# Patient Record
Sex: Female | Born: 1935
Health system: Southern US, Community
[De-identification: ages and names within clinical notes are randomized; demographics above are authoritative.]

## PROBLEM LIST (undated history)

## (undated) DIAGNOSIS — Z862 Personal history of diseases of the blood and blood-forming organs and certain disorders involving the immune mechanism: Secondary | ICD-10-CM

## (undated) DIAGNOSIS — M1990 Unspecified osteoarthritis, unspecified site: Secondary | ICD-10-CM

## (undated) DIAGNOSIS — E7800 Pure hypercholesterolemia, unspecified: Secondary | ICD-10-CM

## (undated) DIAGNOSIS — E039 Hypothyroidism, unspecified: Secondary | ICD-10-CM

## (undated) DIAGNOSIS — T8484XA Pain due to internal orthopedic prosthetic devices, implants and grafts, initial encounter: Secondary | ICD-10-CM

## (undated) HISTORY — PX: BUNIONECTOMY: SHX129

---

## 2002-05-17 ENCOUNTER — Encounter: Admission: RE | Admit: 2002-05-17 | Discharge: 2002-05-17 | Payer: Self-pay | Admitting: Internal Medicine

## 2002-05-17 ENCOUNTER — Encounter: Payer: Self-pay | Admitting: Internal Medicine

## 2003-11-08 ENCOUNTER — Other Ambulatory Visit: Admission: RE | Admit: 2003-11-08 | Discharge: 2003-11-08 | Payer: Self-pay | Admitting: Internal Medicine

## 2004-01-04 ENCOUNTER — Encounter: Admission: RE | Admit: 2004-01-04 | Discharge: 2004-01-04 | Payer: Self-pay | Admitting: Internal Medicine

## 2004-09-05 ENCOUNTER — Encounter: Admission: RE | Admit: 2004-09-05 | Discharge: 2004-09-05 | Payer: Self-pay | Admitting: Internal Medicine

## 2005-02-20 ENCOUNTER — Encounter: Admission: RE | Admit: 2005-02-20 | Discharge: 2005-02-20 | Payer: Self-pay | Admitting: Internal Medicine

## 2006-04-20 ENCOUNTER — Other Ambulatory Visit: Admission: RE | Admit: 2006-04-20 | Discharge: 2006-04-20 | Payer: Self-pay | Admitting: *Deleted

## 2006-04-21 ENCOUNTER — Encounter: Admission: RE | Admit: 2006-04-21 | Discharge: 2006-04-21 | Payer: Self-pay | Admitting: *Deleted

## 2008-07-10 ENCOUNTER — Encounter: Admission: RE | Admit: 2008-07-10 | Discharge: 2008-07-10 | Payer: Self-pay | Admitting: Family Medicine

## 2010-03-01 ENCOUNTER — Encounter
Admission: RE | Admit: 2010-03-01 | Discharge: 2010-03-01 | Payer: Self-pay | Source: Home / Self Care | Attending: Internal Medicine | Admitting: Internal Medicine

## 2010-03-06 ENCOUNTER — Encounter
Admission: RE | Admit: 2010-03-06 | Discharge: 2010-03-06 | Payer: Self-pay | Source: Home / Self Care | Attending: Internal Medicine | Admitting: Internal Medicine

## 2011-03-31 ENCOUNTER — Other Ambulatory Visit: Payer: Self-pay | Admitting: Family Medicine

## 2011-03-31 DIAGNOSIS — Z1231 Encounter for screening mammogram for malignant neoplasm of breast: Secondary | ICD-10-CM

## 2011-06-04 ENCOUNTER — Ambulatory Visit
Admission: RE | Admit: 2011-06-04 | Discharge: 2011-06-04 | Disposition: A | Discharge status: Home / Self Care | Payer: Medicare Other | Source: Ambulatory Visit | Attending: Family Medicine | Admitting: Family Medicine

## 2011-06-04 DIAGNOSIS — Z1231 Encounter for screening mammogram for malignant neoplasm of breast: Secondary | ICD-10-CM

## 2011-08-12 ENCOUNTER — Ambulatory Visit
Admission: RE | Admit: 2011-08-12 | Discharge: 2011-08-12 | Disposition: A | Discharge status: Home / Self Care | Payer: Medicare Other | Source: Ambulatory Visit | Attending: Orthopedic Surgery | Admitting: Orthopedic Surgery

## 2011-08-12 ENCOUNTER — Other Ambulatory Visit: Payer: Self-pay | Admitting: Orthopedic Surgery

## 2011-08-12 DIAGNOSIS — N289 Disorder of kidney and ureter, unspecified: Secondary | ICD-10-CM

## 2011-08-12 MED ORDER — IOHEXOL 350 MG/ML SOLN
100.0000 mL | Freq: Once | INTRAVENOUS | Status: AC | PRN
  Administered 2011-08-12: 100 mL via INTRAVENOUS

## 2011-12-24 ENCOUNTER — Other Ambulatory Visit: Payer: Self-pay | Admitting: Family Medicine

## 2011-12-24 ENCOUNTER — Other Ambulatory Visit (HOSPITAL_COMMUNITY)
Admission: RE | Admit: 2011-12-24 | Discharge: 2011-12-24 | Disposition: A | Discharge status: Home / Self Care | Payer: Medicare Other | Source: Ambulatory Visit | Attending: Family Medicine | Admitting: Family Medicine

## 2011-12-24 DIAGNOSIS — Z780 Asymptomatic menopausal state: Secondary | ICD-10-CM

## 2011-12-24 DIAGNOSIS — Z124 Encounter for screening for malignant neoplasm of cervix: Secondary | ICD-10-CM | POA: Insufficient documentation

## 2011-12-24 DIAGNOSIS — Z1231 Encounter for screening mammogram for malignant neoplasm of breast: Secondary | ICD-10-CM

## 2012-04-30 ENCOUNTER — Other Ambulatory Visit: Payer: Self-pay

## 2012-11-19 ENCOUNTER — Ambulatory Visit (HOSPITAL_COMMUNITY): Payer: Medicare Other | Attending: Internal Medicine | Admitting: Radiology

## 2012-11-19 ENCOUNTER — Other Ambulatory Visit (HOSPITAL_COMMUNITY): Payer: Self-pay | Admitting: Internal Medicine

## 2012-11-19 DIAGNOSIS — M79609 Pain in unspecified limb: Secondary | ICD-10-CM | POA: Insufficient documentation

## 2012-11-19 DIAGNOSIS — R0789 Other chest pain: Secondary | ICD-10-CM | POA: Insufficient documentation

## 2012-11-19 DIAGNOSIS — R6884 Jaw pain: Secondary | ICD-10-CM | POA: Insufficient documentation

## 2012-11-19 DIAGNOSIS — R072 Precordial pain: Secondary | ICD-10-CM

## 2012-11-19 NOTE — Progress Notes (Signed)
Echocardiogram performed.  

## 2012-11-25 IMAGING — CT CT ABDOMEN WO/W CM
2 of 8 series · 4 of 32 positions shown, 9 images · IV contrast ([ID] [id number])
Comparison: None

CLINICAL DATA: Follow-up renal lesion.

BUN and creatinine were obtained on site at [HOSPITAL] at
[HOSPITAL]..
Results:  BUN 24.0 mg/dL,  Creatinine 0.8 mg/dL.
CT ABDOMEN WITHOUT AND WITH CONTRAST
TECHNIQUE: Multidetector CT imaging of the abdomen was performed
following the standard protocol before and during bolus
administration of intravenous contrast.
Contrast: 100mL OMNIPAQUE IOHEXOL 350 MG/ML SOLN

[Series 104: sag art · sagittal · 0.66mm/px · 3 of 147 slices shown, 7 images]
[im 37/147  soft-tissue]
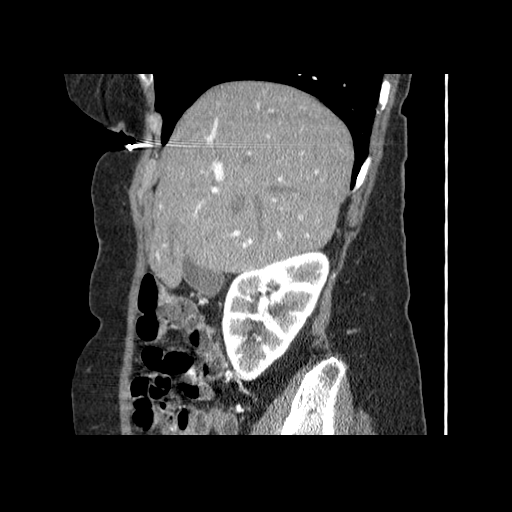
[im 37/147  lung]
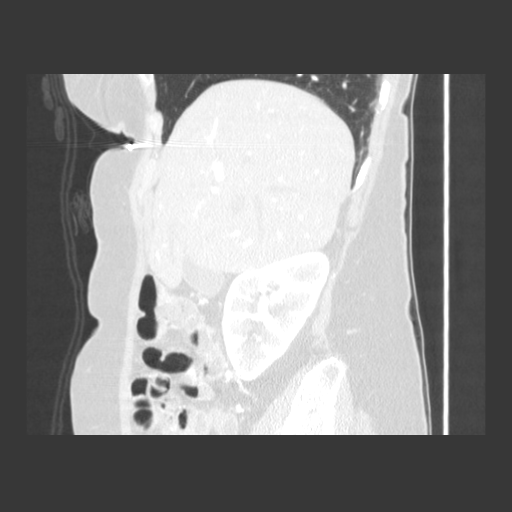
[im 37/147  bone]
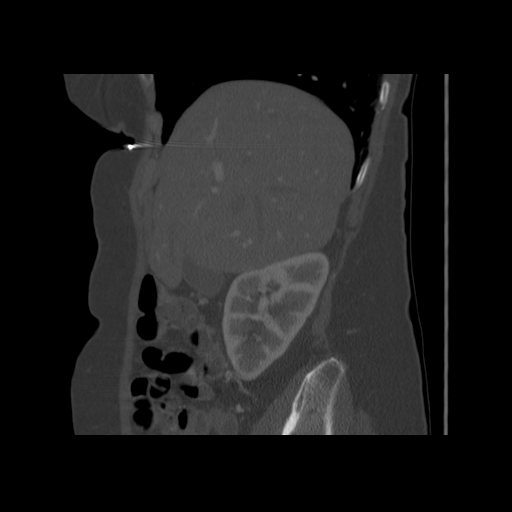
[im 74/147  soft-tissue]
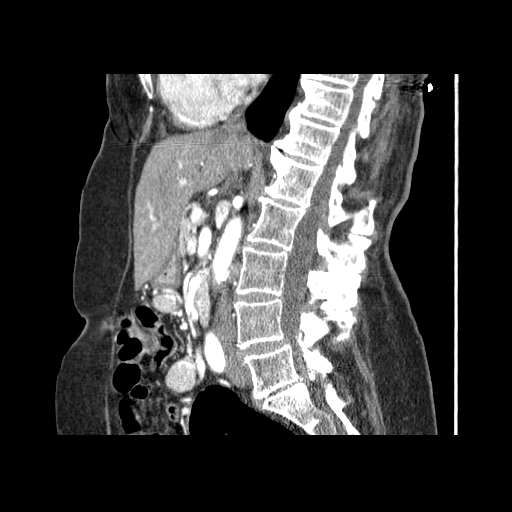
[im 74/147  lung]
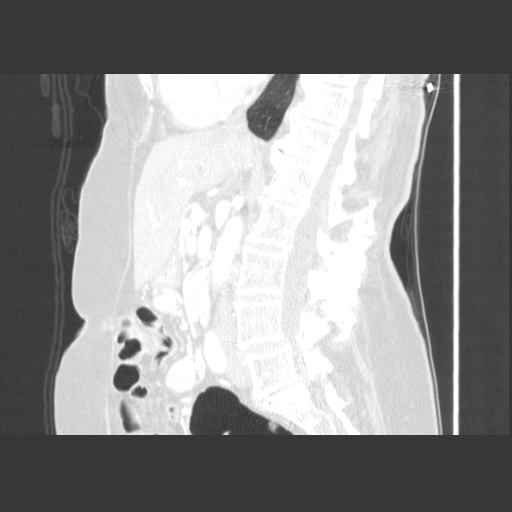
[im 110/147  soft-tissue]
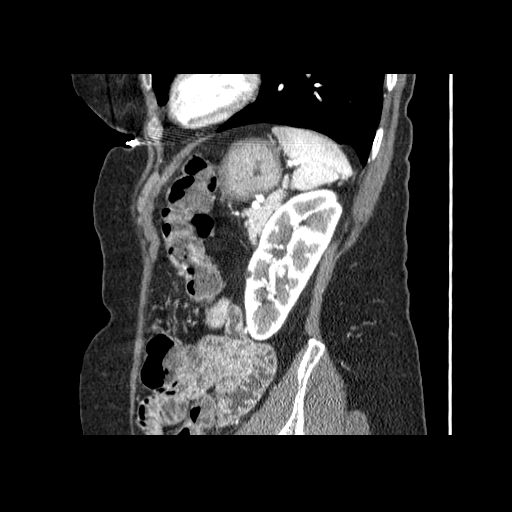
[im 110/147  lung]
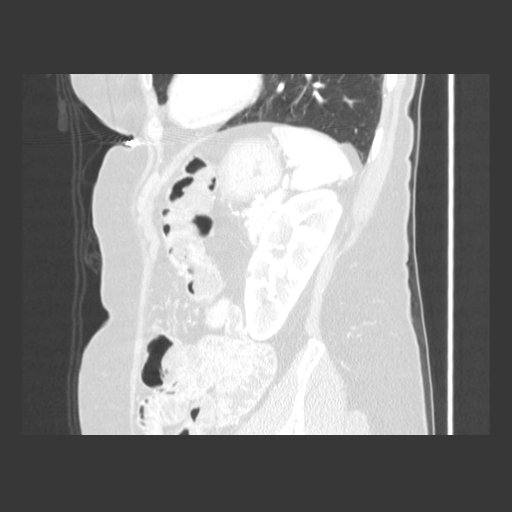

[Series 106: sag nephro · sagittal · 0.66mm/px · 1 of 151 slices shown, 2 images]
[im 76/151  soft-tissue]
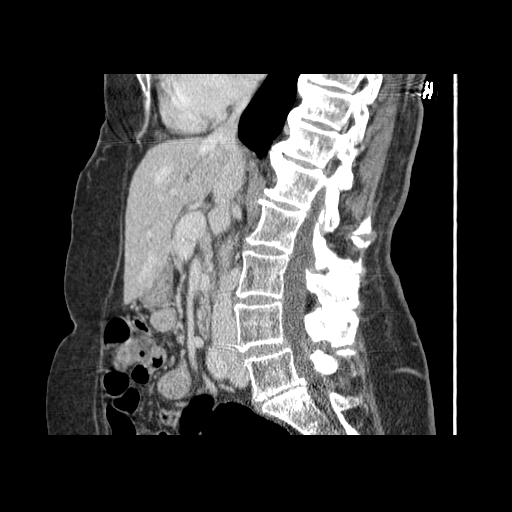
[im 76/151  bone]
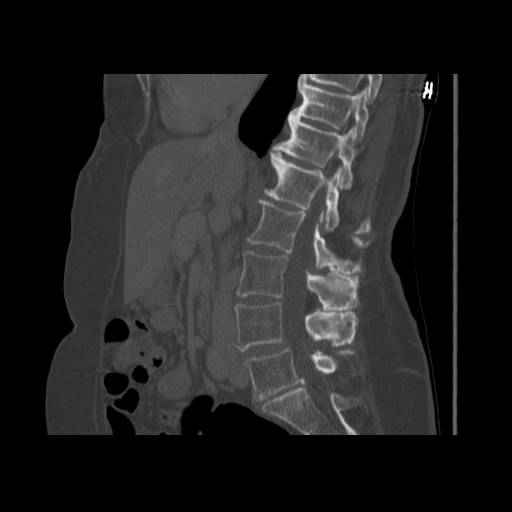

[4 of 32 positions shown; findings below may reference images not displayed]

FINDINGS: The lung bases are clear.

The precontrast images demonstrate a 11 mm lesion involving the
lateral cortex of the right kidney at the lower pole mid pole
junction region.  This has areas of low attenuation.  After
contrast administration there is demonstrable contrast enhancement.
The delayed images demonstrate significant washout of the lesion
with areas of low attenuation suggesting fat.  Findings are most
consistent with a small angiomyolipoma.

There are two cysts associated the left kidney.  No hydronephrosis
or hydroureter.  No collecting system abnormalities on the delayed
images.

The liver is unremarkable.  No focal lesions or intrahepatic
biliary dilatation.  The gallbladder is normal.  No common bile
duct dilatation.  The pancreas is unremarkable.  There appears to
be either a fatty cleft or small lipoma in the pancreatic head.
This measures negative 56 HU.  The spleen is normal in size.  No
focal lesions.  The adrenal glands are unremarkable.

The stomach, duodenum, visualized small bowel and visualized colon
are unremarkable.  No inflammatory changes or mass lesions.  The
aorta demonstrates mild atherosclerotic calcifications but no focal
aneurysm or dissection.  The major branch vessels are patent.  No
mesenteric or retroperitoneal masses or adenopathy.

The bony structures are intact.
IMPRESSION: 1.  11 mm right renal lesion appears to have fatty elements and is
likely a small angiomyolipoma.  A follow-up examination in 1 year
is recommended.
2.  Left renal cysts.
3.  Small fatty cleft versus lipoma in the pancreatic head.

## 2013-05-13 ENCOUNTER — Other Ambulatory Visit: Payer: Self-pay | Admitting: Internal Medicine

## 2013-05-13 DIAGNOSIS — D369 Benign neoplasm, unspecified site: Secondary | ICD-10-CM

## 2013-05-27 ENCOUNTER — Inpatient Hospital Stay: Admission: RE | Admit: 2013-05-27 | Payer: Medicare Other | Source: Ambulatory Visit

## 2013-05-31 ENCOUNTER — Inpatient Hospital Stay: Admission: RE | Admit: 2013-05-31 | Payer: Medicare Other | Source: Ambulatory Visit

## 2013-06-07 ENCOUNTER — Other Ambulatory Visit: Payer: Medicare Other

## 2013-06-13 ENCOUNTER — Ambulatory Visit
Admission: RE | Admit: 2013-06-13 | Discharge: 2013-06-13 | Disposition: A | Discharge status: Home / Self Care | Payer: Medicare Other | Source: Ambulatory Visit | Attending: Internal Medicine | Admitting: Internal Medicine

## 2013-06-13 DIAGNOSIS — D369 Benign neoplasm, unspecified site: Secondary | ICD-10-CM

## 2014-05-29 ENCOUNTER — Other Ambulatory Visit: Payer: Self-pay | Admitting: Internal Medicine

## 2014-05-29 DIAGNOSIS — Z1231 Encounter for screening mammogram for malignant neoplasm of breast: Secondary | ICD-10-CM

## 2014-06-27 ENCOUNTER — Ambulatory Visit
Admission: RE | Admit: 2014-06-27 | Discharge: 2014-06-27 | Disposition: A | Discharge status: Home / Self Care | Payer: Medicare Other | Source: Ambulatory Visit | Attending: Internal Medicine | Admitting: Internal Medicine

## 2014-06-27 ENCOUNTER — Encounter (INDEPENDENT_AMBULATORY_CARE_PROVIDER_SITE_OTHER): Payer: Self-pay

## 2014-06-27 DIAGNOSIS — Z1231 Encounter for screening mammogram for malignant neoplasm of breast: Secondary | ICD-10-CM

## 2014-10-25 NOTE — H&P (Signed)
  Courtney Boyer is an 81 y.o. female.    Chief Complaint: left shoulder pain and weakness  HPI: Pt is a 81 y.o. female complaining of left shoulder pain for multiple months. Pain had continually increased since the beginning. X-rays in the clinic show rotator cuff tear left shoulder. Pt has tried various conservative treatments which have failed to alleviate their symptoms, including injections and therapy. Various options are discussed with the patient. Risks, benefits and expectations were discussed with the patient. Patient understand the risks, benefits and expectations and wishes to proceed with surgery.   PCP:  No primary care provider on file.  D/C Plans: Home  PMH: No past medical history on file.  PSH: No past surgical history on file.  Social History:  has no tobacco, alcohol, and drug history on file.  Allergies:  Allergies not on file  Medications: No current facility-administered medications for this encounter.   No current outpatient prescriptions on file.    No results found for this or any previous visit (from the past 48 hour(s)). No results found.  ROS: Pain with rom of the left upper extremity  Physical Exam:  Alert and oriented 81 y.o. female in no acute distress Cranial nerves 2-12 intact Cervical spine: full rom with no tenderness, nv intact distally Chest: active breath sounds bilaterally, no wheeze rhonchi or rales Heart: regular rate and rhythm, no murmur Abd: non tender non distended with active bowel sounds Hip is stable with rom  Left shoulder with mild to moderate limitation with rom due to pain nv intact distally No rashes or edema Strength of ER and IR 4/5 as compared to right  Assessment/Plan Assessment: left shoulder pain secondary to rotator cuff tear  Plan: Patient will undergo a left shoulder scope and cuff repair by Dr. Veverly Fells at [facility]. Risks benefits and expectations were discussed with the patient. Patient  understand risks, benefits and expectations and wishes to proceed.

## 2014-11-06 NOTE — Pre-Procedure Instructions (Signed)
    Courtney Boyer  11/06/2014     No Pharmacies Listed   Your procedure is scheduled on Friday, September 30th, 2016  Report to [facility] Admitting at 8:25 A.M.  Call this number if you have problems the morning of surgery:  [phone]   Remember:  Do not eat food or drink liquids after midnight.  Take these medicines the morning of surgery with A SIP OF WATER:  Levothyroxine (Synthroid), Tylenol (if needed).   7 days prior to surgery, stop taking the following: NSAIDS, Aspirin, Aleve, Naproxen, Ibuprofen, BC's, Goody's, Advil, Motrin, Fish Oil, all herbal medications, and all vitamins.    Do not wear jewelry, make-up or nail polish.  Do not wear lotions, powders, or perfumes.  You may NOT wear deodorant.  Do not shave 48 hours prior to surgery.    Do not bring valuables to the hospital.  [facility] is not responsible for any belongings or valuables.  Contacts, dentures or bridgework may not be worn into surgery.  Leave your suitcase in the car.  After surgery it may be brought to your room.  For patients admitted to the hospital, discharge time will be determined by your treatment team.  Patients discharged the day of surgery will not be allowed to drive home.   Special instructions:  See attached.   Please read over the following fact sheets that you were given. Pain Booklet, Coughing and Deep Breathing, Surgical Site Infection Prevention and Anesthesia Post-op Instructions

## 2014-11-07 ENCOUNTER — Encounter (HOSPITAL_COMMUNITY)
Admission: RE | Admit: 2014-11-07 | Discharge: 2014-11-07 | Disposition: A | Discharge status: Home / Self Care | Payer: Medicare Other | Source: Ambulatory Visit | Attending: Orthopedic Surgery | Admitting: Orthopedic Surgery

## 2014-11-07 ENCOUNTER — Encounter (HOSPITAL_COMMUNITY): Payer: Self-pay

## 2014-11-07 DIAGNOSIS — Z01812 Encounter for preprocedural laboratory examination: Secondary | ICD-10-CM | POA: Insufficient documentation

## 2014-11-07 DIAGNOSIS — M75102 Unspecified rotator cuff tear or rupture of left shoulder, not specified as traumatic: Secondary | ICD-10-CM | POA: Insufficient documentation

## 2014-11-07 HISTORY — DX: Unspecified osteoarthritis, unspecified site: M19.90

## 2014-11-07 HISTORY — DX: Pure hypercholesterolemia, unspecified: E78.00

## 2014-11-07 HISTORY — DX: Hypothyroidism, unspecified: E03.9

## 2014-11-07 HISTORY — DX: Personal history of diseases of the blood and blood-forming organs and certain disorders involving the immune mechanism: Z86.2

## 2014-11-07 LAB — BASIC METABOLIC PANEL
Anion gap: 9 (ref 5–15)
BUN: 19 mg/dL (ref 6–20)
CHLORIDE: 104 mmol/L (ref 101–111)
CO2: 28 mmol/L (ref 22–32)
CREATININE: 0.78 mg/dL (ref 0.44–1.00)
Calcium: 9.7 mg/dL (ref 8.9–10.3)
Glucose, Bld: 104 mg/dL — ABNORMAL HIGH (ref 65–99)
POTASSIUM: 4.3 mmol/L (ref 3.5–5.1)
SODIUM: 141 mmol/L (ref 135–145)

## 2014-11-07 LAB — CBC
HEMATOCRIT: 39.3 % (ref 36.0–46.0)
HEMOGLOBIN: 13.2 g/dL (ref 12.0–15.0)
MCH: 30.6 pg (ref 26.0–34.0)
MCHC: 33.6 g/dL (ref 30.0–36.0)
MCV: 91.2 fL (ref 78.0–100.0)
Platelets: 258 10*3/uL (ref 150–400)
RBC: 4.31 MIL/uL (ref 3.87–5.11)
RDW: 13.4 % (ref 11.5–15.5)
WBC: 6.3 10*3/uL (ref 4.0–10.5)

## 2014-11-07 NOTE — Progress Notes (Signed)
PCP - Dr. Loleta Books Cardiologist - denies  EKG - 10/06/14 - in chart CXR - pt. Denies  Echo- 11/2012 - Epic Stress Test - pt. States greater than 5 years ago Cardiac Cath - denies  Pt. Denies shortness of breath and chest pain at PAT appointment.

## 2014-11-16 MED ORDER — CHLORHEXIDINE GLUCONATE 4 % EX LIQD: 60.0000 mL | Freq: Once | CUTANEOUS | Status: DC

## 2014-11-16 MED ORDER — CEFAZOLIN SODIUM-DEXTROSE 2-3 GM-% IV SOLR
2.0000 g | INTRAVENOUS | Status: AC
  Administered 2014-11-17: 2 g via INTRAVENOUS
  Filled 2014-11-16: qty 50

## 2014-11-17 ENCOUNTER — Ambulatory Visit (HOSPITAL_COMMUNITY): Payer: Medicare Other | Admitting: Anesthesiology

## 2014-11-17 ENCOUNTER — Ambulatory Visit (HOSPITAL_COMMUNITY)
Admission: RE | Admit: 2014-11-17 | Discharge: 2014-11-17 | Disposition: A | Discharge status: Home / Self Care | Payer: Medicare Other | Source: Ambulatory Visit | Attending: Orthopedic Surgery | Admitting: Orthopedic Surgery

## 2014-11-17 ENCOUNTER — Encounter (HOSPITAL_COMMUNITY): Payer: Self-pay | Admitting: *Deleted

## 2014-11-17 ENCOUNTER — Encounter (HOSPITAL_COMMUNITY)
Admission: RE | Disposition: A | Discharge status: Home / Self Care | Payer: Self-pay | Source: Ambulatory Visit | Attending: Orthopedic Surgery

## 2014-11-17 DIAGNOSIS — S43432A Superior glenoid labrum lesion of left shoulder, initial encounter: Secondary | ICD-10-CM | POA: Diagnosis not present

## 2014-11-17 DIAGNOSIS — M1990 Unspecified osteoarthritis, unspecified site: Secondary | ICD-10-CM | POA: Insufficient documentation

## 2014-11-17 DIAGNOSIS — X58XXXA Exposure to other specified factors, initial encounter: Secondary | ICD-10-CM | POA: Diagnosis not present

## 2014-11-17 DIAGNOSIS — M75102 Unspecified rotator cuff tear or rupture of left shoulder, not specified as traumatic: Secondary | ICD-10-CM | POA: Diagnosis present

## 2014-11-17 DIAGNOSIS — E039 Hypothyroidism, unspecified: Secondary | ICD-10-CM | POA: Diagnosis not present

## 2014-11-17 HISTORY — PX: SHOULDER ARTHROSCOPY WITH SUBACROMIAL DECOMPRESSION AND OPEN ROTATOR C: SHX5688

## 2014-11-17 SURGERY — SHOULDER ARTHROSCOPY WITH SUBACROMIAL DECOMPRESSION AND OPEN ROTATOR CUFF REPAIR, OPEN BICEPS TENDON REPAIR
Anesthesia: General | Site: Shoulder | Laterality: Left

## 2014-11-17 MED ORDER — LIDOCAINE HCL (CARDIAC) 20 MG/ML IV SOLN
INTRAVENOUS | Status: DC | PRN
  Administered 2014-11-17: 20 mg via INTRAVENOUS

## 2014-11-17 MED ORDER — MIDAZOLAM HCL 2 MG/2ML IJ SOLN: 2.0000 mg | Freq: Once | INTRAMUSCULAR | Status: DC

## 2014-11-17 MED ORDER — OXYCODONE-ACETAMINOPHEN 5-325 MG PO TABS: 1.0000 | ORAL_TABLET | ORAL | Status: DC | PRN

## 2014-11-17 MED ORDER — BUPIVACAINE-EPINEPHRINE 0.25% -1:200000 IJ SOLN
INTRAMUSCULAR | Status: DC | PRN
  Administered 2014-11-17: 5 mL

## 2014-11-17 MED ORDER — NEOSTIGMINE METHYLSULFATE 10 MG/10ML IV SOLN
INTRAVENOUS | Status: DC | PRN
  Administered 2014-11-17: 3 mg via INTRAVENOUS

## 2014-11-17 MED ORDER — SODIUM CHLORIDE 0.9 % IV SOLN
10.0000 mg | INTRAVENOUS | Status: DC | PRN
  Administered 2014-11-17: 10 ug/min via INTRAVENOUS

## 2014-11-17 MED ORDER — FENTANYL CITRATE (PF) 250 MCG/5ML IJ SOLN
INTRAMUSCULAR | Status: AC
  Filled 2014-11-17: qty 5

## 2014-11-17 MED ORDER — FENTANYL CITRATE (PF) 100 MCG/2ML IJ SOLN
INTRAMUSCULAR | Status: AC
  Administered 2014-11-17: 100 ug via INTRAVENOUS
  Filled 2014-11-17: qty 2

## 2014-11-17 MED ORDER — BUPIVACAINE-EPINEPHRINE (PF) 0.5% -1:200000 IJ SOLN
INTRAMUSCULAR | Status: DC | PRN
  Administered 2014-11-17: 25 mL via PERINEURAL

## 2014-11-17 MED ORDER — PROMETHAZINE HCL 25 MG/ML IJ SOLN: 6.2500 mg | INTRAMUSCULAR | Status: DC | PRN

## 2014-11-17 MED ORDER — ONDANSETRON HCL 4 MG/2ML IJ SOLN
INTRAMUSCULAR | Status: AC
  Filled 2014-11-17: qty 2

## 2014-11-17 MED ORDER — GLYCOPYRROLATE 0.2 MG/ML IJ SOLN
INTRAMUSCULAR | Status: AC
  Filled 2014-11-17: qty 2

## 2014-11-17 MED ORDER — METHOCARBAMOL 500 MG PO TABS: 500.0000 mg | ORAL_TABLET | Freq: Three times a day (TID) | ORAL | Status: DC | PRN

## 2014-11-17 MED ORDER — LACTATED RINGERS IV SOLN
INTRAVENOUS | Status: DC
  Administered 2014-11-17: 10:00:00 via INTRAVENOUS

## 2014-11-17 MED ORDER — ONDANSETRON HCL 4 MG/2ML IJ SOLN
INTRAMUSCULAR | Status: DC | PRN
  Administered 2014-11-17: 4 mg via INTRAVENOUS

## 2014-11-17 MED ORDER — NEOSTIGMINE METHYLSULFATE 10 MG/10ML IV SOLN
INTRAVENOUS | Status: AC
  Filled 2014-11-17: qty 1

## 2014-11-17 MED ORDER — MEPERIDINE HCL 25 MG/ML IJ SOLN: 6.2500 mg | INTRAMUSCULAR | Status: DC | PRN

## 2014-11-17 MED ORDER — ROCURONIUM BROMIDE 100 MG/10ML IV SOLN
INTRAVENOUS | Status: DC | PRN
  Administered 2014-11-17: 30 mg via INTRAVENOUS

## 2014-11-17 MED ORDER — PROPOFOL 10 MG/ML IV BOLUS
INTRAVENOUS | Status: DC | PRN
  Administered 2014-11-17: 130 mg via INTRAVENOUS

## 2014-11-17 MED ORDER — BUPIVACAINE-EPINEPHRINE (PF) 0.25% -1:200000 IJ SOLN
INTRAMUSCULAR | Status: AC
  Filled 2014-11-17: qty 30

## 2014-11-17 MED ORDER — MIDAZOLAM HCL 2 MG/2ML IJ SOLN
INTRAMUSCULAR | Status: AC
  Filled 2014-11-17: qty 2

## 2014-11-17 MED ORDER — HYDROMORPHONE HCL 1 MG/ML IJ SOLN: 0.2500 mg | INTRAMUSCULAR | Status: DC | PRN

## 2014-11-17 MED ORDER — GLYCOPYRROLATE 0.2 MG/ML IJ SOLN
INTRAMUSCULAR | Status: DC | PRN
  Administered 2014-11-17: 0.4 mg via INTRAVENOUS

## 2014-11-17 MED ORDER — FENTANYL CITRATE (PF) 100 MCG/2ML IJ SOLN
100.0000 ug | Freq: Once | INTRAMUSCULAR | Status: AC
  Administered 2014-11-17: 100 ug via INTRAVENOUS

## 2014-11-17 MED ORDER — LIDOCAINE HCL (CARDIAC) 20 MG/ML IV SOLN
INTRAVENOUS | Status: AC
  Filled 2014-11-17: qty 5

## 2014-11-17 MED ORDER — SODIUM CHLORIDE 0.9 % IR SOLN
Status: DC | PRN
  Administered 2014-11-17: 6000 mL

## 2014-11-17 MED ORDER — KETOROLAC TROMETHAMINE 30 MG/ML IJ SOLN
15.0000 mg | Freq: Once | INTRAMUSCULAR | Status: AC
  Administered 2014-11-17: 15 mg via INTRAVENOUS

## 2014-11-17 SURGICAL SUPPLY — 73 items
ANCH SUT 2 1.3X1 LD 1 STRN (Anchor) ×1 IMPLANT
ANCH SUT 360D 2 2 LD 2 STRN (Anchor) ×1 IMPLANT
ANCHOR ALL- SUT RC 2 SUT Y-K (Anchor) ×2 IMPLANT
ANCHOR ALL-SUT FLEX 1.3 Y-KNOT (Anchor) ×2 IMPLANT
ANCHOR ALL-SUT RC 2 SUT Y-K (Anchor) IMPLANT
BIT DRILL 1.3M DISPOSABLE (BIT) ×2 IMPLANT
BLADE SURG 11 STRL SS (BLADE) ×3 IMPLANT
BLADE W/14.0X25.5MM (BLADE) ×3 IMPLANT
BUR OVAL 4.0 (BURR) IMPLANT
CLOSURE STERI-STRIP 1/2X4 (GAUZE/BANDAGES/DRESSINGS) ×1
CLOSURE WOUND 1/2 X4 (GAUZE/BANDAGES/DRESSINGS) ×1
CLSR STERI-STRIP ANTIMIC 1/2X4 (GAUZE/BANDAGES/DRESSINGS) ×1 IMPLANT
COVER SURGICAL LIGHT HANDLE (MISCELLANEOUS) ×3 IMPLANT
DRAPE INCISE IOBAN 66X45 STRL (DRAPES) ×3 IMPLANT
DRAPE STERI 35X30 U-POUCH (DRAPES) ×3 IMPLANT
DRAPE U-SHAPE 47X51 STRL (DRAPES) ×3 IMPLANT
DRSG EMULSION OIL 3X3 NADH (GAUZE/BANDAGES/DRESSINGS) ×4 IMPLANT
DRSG PAD ABDOMINAL 8X10 ST (GAUZE/BANDAGES/DRESSINGS) ×4 IMPLANT
DURAPREP 26ML APPLICATOR (WOUND CARE) ×3 IMPLANT
ELECT NDL TIP 2.8 STRL (NEEDLE) ×1 IMPLANT
ELECT NEEDLE TIP 2.8 STRL (NEEDLE) ×3 IMPLANT
ELECT REM PT RETURN 9FT ADLT (ELECTROSURGICAL)
ELECTRODE REM PT RTRN 9FT ADLT (ELECTROSURGICAL) IMPLANT
GAUZE SPONGE 4X4 12PLY STRL (GAUZE/BANDAGES/DRESSINGS) ×3 IMPLANT
GLOVE BIOGEL PI ORTHO PRO 7.5 (GLOVE) ×2
GLOVE BIOGEL PI ORTHO PRO SZ7 (GLOVE) ×2
GLOVE BIOGEL PI ORTHO PRO SZ8 (GLOVE) ×2
GLOVE ORTHO TXT STRL SZ7.5 (GLOVE) ×3 IMPLANT
GLOVE PI ORTHO PRO STRL 7.5 (GLOVE) ×1 IMPLANT
GLOVE PI ORTHO PRO STRL SZ7 (GLOVE) IMPLANT
GLOVE PI ORTHO PRO STRL SZ8 (GLOVE) ×1 IMPLANT
GLOVE SURG ORTHO 8.5 STRL (GLOVE) ×3 IMPLANT
GOWN STRL REUS W/ TWL XL LVL3 (GOWN DISPOSABLE) ×4 IMPLANT
GOWN STRL REUS W/TWL XL LVL3 (GOWN DISPOSABLE) ×12
KIT BASIN OR (CUSTOM PROCEDURE TRAY) ×3 IMPLANT
KIT ROOM TURNOVER OR (KITS) ×3 IMPLANT
MANIFOLD NEPTUNE II (INSTRUMENTS) ×3 IMPLANT
NDL HYPO 25GX1X1/2 BEV (NEEDLE) ×1 IMPLANT
NDL SPNL 18GX3.5 QUINCKE PK (NEEDLE) ×1 IMPLANT
NDL SUT .5 MAYO 1.404X.05X (NEEDLE) ×1 IMPLANT
NDL SUT 6 .5 CRC .975X.05 MAYO (NEEDLE) ×1 IMPLANT
NEEDLE HYPO 25GX1X1/2 BEV (NEEDLE) ×3 IMPLANT
NEEDLE MAYO TAPER (NEEDLE) ×6
NEEDLE SPNL 18GX3.5 QUINCKE PK (NEEDLE) ×3 IMPLANT
NS IRRIG 1000ML POUR BTL (IV SOLUTION) ×3 IMPLANT
PACK SHOULDER (CUSTOM PROCEDURE TRAY) ×3 IMPLANT
PAD ARMBOARD 7.5X6 YLW CONV (MISCELLANEOUS) ×6 IMPLANT
RESECTOR FULL RADIUS 4.2MM (BLADE) ×3 IMPLANT
SET ARTHROSCOPY TUBING (MISCELLANEOUS) ×3
SET ARTHROSCOPY TUBING LN (MISCELLANEOUS) ×1 IMPLANT
SLING ARM LRG ADULT FOAM STRAP (SOFTGOODS) ×3 IMPLANT
SLING ARM MED ADULT FOAM STRAP (SOFTGOODS) IMPLANT
SPONGE GAUZE 4X4 12PLY STER LF (GAUZE/BANDAGES/DRESSINGS) ×2 IMPLANT
STRIP CLOSURE SKIN 1/2X4 (GAUZE/BANDAGES/DRESSINGS) ×2 IMPLANT
SUT BONE WAX W31G (SUTURE) ×3 IMPLANT
SUT FIBERWIRE #2 38 T-5 BLUE (SUTURE)
SUT HI-FI #2 40IN 26MM (SUTURE) ×4 IMPLANT
SUT MNCRL AB 3-0 PS2 18 (SUTURE) ×3 IMPLANT
SUT VIC AB 0 CT1 27 (SUTURE) ×3
SUT VIC AB 0 CT1 27XBRD ANBCTR (SUTURE) ×1 IMPLANT
SUT VIC AB 0 CT2 27 (SUTURE) IMPLANT
SUT VIC AB 2-0 CT1 27 (SUTURE) ×3
SUT VIC AB 2-0 CT1 TAPERPNT 27 (SUTURE) ×1 IMPLANT
SUT VICRYL 0 CT 1 36IN (SUTURE) ×3 IMPLANT
SUTURE FIBERWR #2 38 T-5 BLUE (SUTURE) IMPLANT
SYR CONTROL 10ML LL (SYRINGE) ×3 IMPLANT
TAPE CLOTH SURG 6X10 WHT LF (GAUZE/BANDAGES/DRESSINGS) ×2 IMPLANT
TOWEL OR 17X24 6PK STRL BLUE (TOWEL DISPOSABLE) ×3 IMPLANT
TOWEL OR 17X26 10 PK STRL BLUE (TOWEL DISPOSABLE) ×3 IMPLANT
TUBE CONNECTING 12'X1/4 (SUCTIONS) ×1
TUBE CONNECTING 12X1/4 (SUCTIONS) ×2 IMPLANT
WAND HAND CNTRL MULTIVAC 90 (MISCELLANEOUS) ×3 IMPLANT
WATER STERILE IRR 1000ML POUR (IV SOLUTION) ×3 IMPLANT

## 2014-11-17 NOTE — Progress Notes (Signed)
CRNA at bedside to take pt to OR.

## 2014-11-17 NOTE — Anesthesia Preprocedure Evaluation (Addendum)
Anesthesia Evaluation  Patient identified by MRN, date of birth, ID band Patient awake    Reviewed: Allergy & Precautions, NPO status , Patient's Chart, lab work & pertinent test results  Airway Mallampati: II  TM Distance: >3 FB Neck ROM: Full    Dental no notable dental hx.    Pulmonary neg pulmonary ROS,    Pulmonary exam normal breath sounds clear to auscultation       Cardiovascular negative cardio ROS Normal cardiovascular exam Rhythm:Regular Rate:Normal     Neuro/Psych negative neurological ROS  negative psych ROS   GI/Hepatic negative GI ROS, Neg liver ROS,   Endo/Other  Hypothyroidism   Renal/GU negative Renal ROS     Musculoskeletal  (+) Arthritis ,   Abdominal   Peds  Hematology negative hematology ROS (+)   Anesthesia Other Findings   Reproductive/Obstetrics negative OB ROS                             Anesthesia Physical Anesthesia Plan  ASA: II  Anesthesia Plan: General   Post-op Pain Management: GA combined w/ Regional for post-op pain   Induction: Intravenous  Airway Management Planned: Oral ETT  Additional Equipment:   Intra-op Plan:   Post-operative Plan: Extubation in OR  Informed Consent: I have reviewed the patients History and Physical, chart, labs and discussed the procedure including the risks, benefits and alternatives for the proposed anesthesia with the patient or authorized representative who has indicated his/her understanding and acceptance.   Dental advisory given  Plan Discussed with: CRNA  Anesthesia Plan Comments:        Anesthesia Quick Evaluation

## 2014-11-17 NOTE — Anesthesia Procedure Notes (Addendum)
Anesthesia Regional Block:  Interscalene brachial plexus block  Pre-Anesthetic Checklist: ,, timeout performed, Correct Patient, Correct Site, Correct Laterality, Correct Procedure, Correct Position, site marked, Risks and benefits discussed, Surgical consent,  Pre-op evaluation,  Post-op pain management  Laterality: Left  Prep: chloraprep       Needles:  Injection technique: Single-shot  Needle Type: Stimulator Needle - 40     Needle Length: 4cm 4 cm Needle Gauge: 22 and 22 G    Additional Needles:  Procedures: ultrasound guided (picture in chart) Interscalene brachial plexus block Narrative:  Injection made incrementally with aspirations every 5 mL.  Performed by: Personally  Anesthesiologist: Nolon Nations  Additional Notes: BP cuff, EKG monitors applied. Sedation begun. Nerve location verified with U/S. Anesthetic injected incrementally, slowly , and after neg aspirations under direct u/s guidance. Good perineural spread. Tolerated well.   Procedure Name: Intubation Date/Time: 11/17/2014 10:19 AM Performed by: Kyung Rudd Pre-anesthesia Checklist: Patient identified, Emergency Drugs available, Suction available, Patient being monitored and Timeout performed Patient Re-evaluated:Patient Re-evaluated prior to inductionOxygen Delivery Method: Circle system utilized Preoxygenation: Pre-oxygenation with 100% oxygen Intubation Type: IV induction Ventilation: Mask ventilation without difficulty Laryngoscope Size: Mac and 3 Grade View: Grade I Tube type: Oral Tube size: 7.0 mm Number of attempts: 1 Airway Equipment and Method: Stylet Placement Confirmation: ETT inserted through vocal cords under direct vision,  positive ETCO2 and breath sounds checked- equal and bilateral Secured at: 21 cm Tube secured with: Tape Dental Injury: Teeth and Oropharynx as per pre-operative assessment

## 2014-11-17 NOTE — Anesthesia Postprocedure Evaluation (Signed)
Anesthesia Post Note  Patient: Courtney Boyer  Procedure(s) Performed: Procedure(s) (LRB): LEFT SHOULDER ARTHROSCOPY WITH SUBACROMIAL DECOMPRESSION, MINI OPEN ROTATOR CUFF REPAIR (Left)  Anesthesia type: General  Patient location: PACU  Post pain: Pain level controlled  Post assessment: Post-op Vital signs reviewed  Last Vitals: BP 133/69 mmHg  Pulse 57  Temp(Src) 36.1 C (Oral)  Resp 17  Ht 5\' 2"  (1.575 m)  Wt 137 lb 4.8 oz (62.279 kg)  BMI 25.11 kg/m2  SpO2 98%  Post vital signs: Reviewed  Level of consciousness: sedated  Complications: No apparent anesthesia complications

## 2014-11-17 NOTE — Brief Op Note (Signed)
11/17/2014  11:59 AM  PATIENT:  Courtney Boyer  81 y.o. female  PRE-OPERATIVE DIAGNOSIS:  LEFT SHOULDER ROTATOR CUFF TEAR, SLAP TEAR  POST-OPERATIVE DIAGNOSIS:  LEFT SHOULDER ROTATOR CUFF TEAR,SLAP TEAR  PROCEDURE:  Procedure(s): LEFT SHOULDER ARTHROSCOPY WITH SUBACROMIAL DECOMPRESSION, MINI OPEN ROTATOR CUFF REPAIR (Left) SLAP DEBRIDEMENT, BICEPS TENODESIS  SURGEON:  Surgeon(s) and Role:    * [name], MD - Primary  PHYSICIAN ASSISTANT:   ASSISTANTS: Ventura Bruns, PA-C   ANESTHESIA:   regional and general  EBL:     BLOOD ADMINISTERED:none  DRAINS: none   LOCAL MEDICATIONS USED:  MARCAINE     SPECIMEN:  No Specimen  DISPOSITION OF SPECIMEN:  N/A  COUNTS:  YES  TOURNIQUET:  * No tourniquets in log *  DICTATION: .Other Dictation: Dictation Number [id number]  PLAN OF CARE: Discharge to home after PACU  PATIENT DISPOSITION:  PACU - hemodynamically stable.   Delay start of Pharmacological VTE agent (>24hrs) due to surgical blood loss or risk of bleeding: not applicable

## 2014-11-17 NOTE — Discharge Instructions (Signed)
Ice to the shoulder at all times.  May remove the sling and hug a pillow when in the house.  It is best to sleep in a recliner or propped up in bed. Keep support behind the elbow to keep the arm across your waist.  Do exercises every hour for five minutes, start today.  Pendulums, lap or pillow slides, door hinge exercises(rotation), assisted lifts.    Keep the incision covered for 5 days, then ok to get wet in the shower.  May change to Band Aids on Monday.  No lifting Pushing or Pulling with the left arm.  Follow up with Dr Veverly Fells in two weeks,  [phone]

## 2014-11-17 NOTE — Interval H&P Note (Signed)
History and Physical Interval Note:  11/17/2014 9:58 AM  Courtney Boyer  has presented today for surgery, with the diagnosis of LEFT SHOULDER ROTATOR CUFF TEAR  The various methods of treatment have been discussed with the patient and family. After consideration of risks, benefits and other options for treatment, the patient has consented to  Procedure(s): LEFT SHOULDER ARTHROSCOPY WITH SUBACROMIAL DECOMPRESSION, MINI OPEN ROTATOR CUFF REPAIR (Left) as a surgical intervention .  The patient's history has been reviewed, patient examined, no change in status, stable for surgery.  I have reviewed the patient's chart and labs.  Questions were answered to the patient's satisfaction.     NORRIS,STEVEN R

## 2014-11-17 NOTE — Transfer of Care (Signed)
Immediate Anesthesia Transfer of Care Note  Patient: Courtney Boyer  Procedure(s) Performed: Procedure(s): LEFT SHOULDER ARTHROSCOPY WITH SUBACROMIAL DECOMPRESSION, MINI OPEN ROTATOR CUFF REPAIR (Left)  Patient Location: PACU  Anesthesia Type:GA combined with regional for post-op pain  Level of Consciousness: awake, alert  and oriented  Airway & Oxygen Therapy: Patient Spontanous Breathing and Patient connected to nasal cannula oxygen  Post-op Assessment: Report given to RN, Post -op Vital signs reviewed and stable and Patient moving all extremities  Post vital signs: Reviewed and stable  Last Vitals:  Filed Vitals:   11/17/14 1005  BP:   Pulse: 68  Temp:   Resp: 16    Complications: No apparent anesthesia complications

## 2014-11-17 NOTE — Progress Notes (Signed)
Orthopedic Tech Progress Note Patient Details:  Courtney Boyer 17-Jun-1933 [id number]  Ortho Devices Type of Ortho Device: Abduction pillow Ortho Device/Splint Location: Don joy abduction pillow Ortho Device/Splint Interventions: Application   [place] Pink 11/17/2014, 1:54 PM

## 2014-11-18 NOTE — Op Note (Signed)
Courtney Boyer, Courtney Boyer             ACCOUNT NO.:  [id number]  MEDICAL RECORD NO.:  [id number]  LOCATION:  [facility]                         FACILITY:  [facility]  PHYSICIAN:  Doran Heater. Veverly Fells, M.D. DATE OF BIRTH:  02-Feb-1934  DATE OF PROCEDURE:  11/17/2014 DATE OF DISCHARGE:  11/17/2014                              OPERATIVE REPORT   PREOPERATIVE DIAGNOSIS:  Left shoulder rotator cuff tear and superior labrum anterior and posterior tear.  POSTOPERATIVE DIAGNOSIS:  Left shoulder rotator cuff tear and superior labrum anterior and posterior tear.  PROCEDURE PERFORMED:  Left shoulder arthroscopy with extensive intra- articular debridement of torn superior labrum anterior to posterior, arthroscopic biceps tenotomy, arthroscopic subacromial decompression followed by mini-open rotator cuff repair and biceps tenodesis in the groove.  ATTENDING SURGEON:  Doran Heater. Veverly Fells, M.D.  ASSISTANT:  Abbott Pao. Dixon, P.A., who has scrubbed in the entire procedure and necessary for satisfactory completion of surgery.  ANESTHESIA:  General anesthesia was used plus interscalene block.  ESTIMATED BLOOD LOSS:  Minimal.  FLUID REPLACEMENT:  1200 mL of crystalloid.  INSTRUMENT COUNTS:  Correct.  COMPLICATIONS:  There were no complications.  ANTIBIOTICS:  Perioperative antibiotics were given.  INDICATIONS:  The patient is a 81 year old female who presents with a history of worsening left shoulder pain and dysfunction secondary to rotator cuff tear and SLAP lesion.  The patient had attempts at conservative management including injections, modification of activity, anti-inflammatories, and has persistent shoulder pain, and as such, impacted on her activities of daily living and her quality of life, so she is desiring shoulder surgery.  Risks and benefits of surgery were discussed.  The patient was cleared from Internal Medicine standpoint. Informed consent was obtained.  DESCRIPTION OF PROCEDURE:  After  adequate level of anesthesia was achieved, the patient was positioned in modified beach-chair position. Left shoulder correctly identified and sterilely prepped and draped in usual manner.  Time-out was called.  We entered the shoulder in standard arthroscopic portals including anterior, posterior, and lateral portals. We identified significant tearing in the superior labrum biceps junction.  We performed a biceps tenotomy and labral debridement back to stable labral rim and majority of the superior labrum had to be removed. Articular cartilage had some grade 3 chondromalacia centrally.  We removed that using suction shaver, ellipsed both on the glenoid and the humeral head.  Rotator cuff was clearly torn.  This was a supraspinatus tear.  Infraspinatus and teres minor appeared normal.  Subscapularis appeared normal.  We placed a scope in subacromial space.  Thorough bursectomy and acromioplasty were performed, creating a type 1 acromial shape with a butcher-block technique utilizing a high-speed burr.  We had nice decompression of that rotator cuff outlet.  We were able to visualize rotator cuff tear.  There was a full-thickness tear involving supraspinatus tendon.  We concluded the arthroscopic portion of the procedure.  We then made a small mini-open incision, started at the anterolateral border of the acromion and staying distally about 3 to 4 cm and the raphe between the anterior and lateral head of the deltoid dissection down to the subcutaneous tissues.  We identified the deltoid raphe and split that.  We placed Arthrex retractor, identified the  biceps groove, delivered the biceps tendon out of the wound.  We prepared the floor of the biceps groove with a needle-tip Bovie and a Soil scientist.  We placed a Y-Knot Flex anchor to the floor of the biceps groove and brought that up in a whipstitch fashion through the biceps tendon, tenodesing the tendon mid tension with the elbow at  90 degrees.  We also over sewed the soft tissue over the top of that with 0 Vicryl suture.  We did address the rotator cuff tear, placed #2 Hi-Fi suture through the free edge of the tendon, mobilized with a Cobb elevator on both sides of the tear.  We then used a combination of side- to-side and also anchor repair.  We placed a Y-Knot RC anchor, double loaded with #2 Hi-Fi suture at the articular margin of the rotator cuff footprint.  We used a rongeur to freshen up the rotator cuff footprint and get the bleeding bone.  Once that medial most anchor was placed and the 2 mattress sutures were thrown, then we took our lateral sutures down through drill holes and also did the side-to-side repair as well. We had an anatomic repair not under tension, ranged the shoulder fully, no tethering, no impingement.  We thoroughly irrigated the shoulder and then repaired the deltoid anatomically with 0 Vicryl suture followed by 2-0 Vicryl subcutaneous closure, and 4-0 Monocryl for skin.  Steri- Strips applied followed by sterile dressing.  The patient tolerated the surgery well.     Doran Heater. Veverly Fells, M.D.     SRN/MEDQ  D:  11/17/2014  T:  11/18/2014  Job:  [id number]

## 2014-11-19 ENCOUNTER — Encounter (HOSPITAL_COMMUNITY): Payer: Self-pay | Admitting: Orthopedic Surgery

## 2014-11-20 ENCOUNTER — Encounter (HOSPITAL_COMMUNITY): Payer: Self-pay | Admitting: Orthopedic Surgery

## 2014-11-21 ENCOUNTER — Encounter (HOSPITAL_COMMUNITY): Payer: Self-pay | Admitting: Orthopedic Surgery

## 2016-01-24 NOTE — H&P (Signed)
  Courtney Boyer is an 82 y.o. female.    Chief Complaint: right shoulder pain  HPI: Pt is a 82 y.o. female complaining of right shoulder pain for multiple years. Pain had continually increased since the beginning. X-rays in the clinic show end-stage arthritic changes of the right shoulder. Pt has tried various conservative treatments which have failed to alleviate their symptoms, including injections and therapy. Various options are discussed with the patient. Risks, benefits and expectations were discussed with the patient. Patient understand the risks, benefits and expectations and wishes to proceed with surgery.   PCP:  [name], MD  D/C Plans: Home  PMH: Past Medical History:  Diagnosis Date  . Arthritis   . High cholesterol   . History of anemia   . Hypothyroidism     PSH: Past Surgical History:  Procedure Laterality Date  . BUNIONECTOMY Bilateral   . SHOULDER ARTHROSCOPY WITH SUBACROMIAL DECOMPRESSION AND OPEN ROTATOR C Left 11/17/2014   Procedure: LEFT SHOULDER ARTHROSCOPY WITH SUBACROMIAL DECOMPRESSION, MINI OPEN ROTATOR CUFF REPAIR;  Surgeon: [name], MD;  Location: [facility];  Service: Orthopedics;  Laterality: Left;    Social History:  reports that she has never smoked. She has never used smokeless tobacco. She reports that she drinks alcohol. She reports that she does not use drugs.  Allergies:  No Known Allergies  Medications: No current facility-administered medications for this encounter.    Current Outpatient Prescriptions  Medication Sig Dispense Refill  . alendronate (FOSAMAX) 70 MG tablet Take 70 mg by mouth once a week. Take with a full glass of water on an empty stomach.  SUNDAY    . atorvastatin (LIPITOR) 40 MG tablet Take 40 mg by mouth daily.    . Cholecalciferol (VITAMIN D3) 2000 UNITS TABS Take 1 tablet by mouth daily.    Marland Kitchen levothyroxine (SYNTHROID, LEVOTHROID) 75 MCG tablet Take 75 mcg by mouth daily before breakfast.    . Magnesium 250  MG TABS Take 1 tablet by mouth daily.    . methocarbamol (ROBAXIN) 500 MG tablet Take 1 tablet (500 mg total) by mouth 3 (three) times daily as needed. 60 tablet 1  . Multiple Vitamins-Minerals (CENTRUM SILVER ULTRA WOMENS PO) Take 1 tablet by mouth daily.    . Multiple Vitamins-Minerals (PRESERVISION AREDS 2 PO) Take 2 capsules by mouth daily.    Marland Kitchen oxyCODONE-acetaminophen (ROXICET) 5-325 MG tablet Take 1-2 tablets by mouth every 4 (four) hours as needed for severe pain. 60 tablet 0    No results found for this or any previous visit (from the past 48 hour(s)). No results found.  ROS: Pain with rom of the right upper extremity  Physical Exam:  Alert and oriented 82 y.o. female in no acute distress Cranial nerves 2-12 intact Cervical spine: full rom with no tenderness, nv intact distally Chest: active breath sounds bilaterally, no wheeze rhonchi or rales Heart: regular rate and rhythm, no murmur Abd: non tender non distended with active bowel sounds Hip is stable with rom  Right shoulder with moderate crepitus with rom rom moderately limited  nv intact distally No rashes or edema  Assessment/Plan Assessment: right shoulder end stage osteoarthritis and cuff arthropathy  Plan: Patient will undergo a right reverse total shoulder by Dr. Veverly Fells at [facility]. Risks benefits and expectations were discussed with the patient. Patient understand risks, benefits and expectations and wishes to proceed.

## 2016-01-30 ENCOUNTER — Other Ambulatory Visit: Payer: Self-pay

## 2016-01-30 ENCOUNTER — Encounter (HOSPITAL_COMMUNITY)
Admission: RE | Admit: 2016-01-30 | Discharge: 2016-01-30 | Disposition: A | Discharge status: Home / Self Care | Payer: Medicare HMO | Source: Ambulatory Visit | Attending: Orthopedic Surgery | Admitting: Orthopedic Surgery

## 2016-01-30 ENCOUNTER — Other Ambulatory Visit (HOSPITAL_COMMUNITY): Payer: Self-pay

## 2016-01-30 DIAGNOSIS — Z01818 Encounter for other preprocedural examination: Secondary | ICD-10-CM | POA: Diagnosis present

## 2016-01-30 DIAGNOSIS — M12811 Other specific arthropathies, not elsewhere classified, right shoulder: Secondary | ICD-10-CM | POA: Diagnosis not present

## 2016-01-30 LAB — SURGICAL PCR SCREEN
MRSA, PCR: NEGATIVE
STAPHYLOCOCCUS AUREUS: NEGATIVE

## 2016-01-30 LAB — CBC
HCT: 38.6 % (ref 36.0–46.0)
Hemoglobin: 12.7 g/dL (ref 12.0–15.0)
MCH: 30.2 pg (ref 26.0–34.0)
MCHC: 32.9 g/dL (ref 30.0–36.0)
MCV: 91.7 fL (ref 78.0–100.0)
PLATELETS: 321 10*3/uL (ref 150–400)
RBC: 4.21 MIL/uL (ref 3.87–5.11)
RDW: 13.5 % (ref 11.5–15.5)
WBC: 5.3 10*3/uL (ref 4.0–10.5)

## 2016-01-30 NOTE — Pre-Procedure Instructions (Signed)
    Airyonna Bevard Kahler  01/30/2016      [id number] - [address], [address] AT [address] [place] [address] Phone: [phone] Fax: [phone]    Your procedure is scheduled on DEC 18.  Report to [facility] Admitting at 5:30 A.M.  Call this number if you have problems the morning of surgery:  [phone]   Remember:  Do not eat food or drink liquids after midnight.  Take these medicines the morning of surgery with A SIP OF WATER : levothyroxine (SYNTHROID, LEVOTHROID)   STOP HERBAL MEDICATIONS, NSAIDS, ASPIRIN    Do not wear jewelry, make-up or nail polish.  Do not wear lotions, powders, or perfumes, or deoderant.  Do not shave 48 hours prior to surgery.  Men may shave face and neck.  Do not bring valuables to the hospital.  [facility] is not responsible for any belongings or valuables.  Contacts, dentures or bridgework may not be worn into surgery.  Leave your suitcase in the car.  After surgery it may be brought to your room.  For patients admitted to the hospital, discharge time will be determined by your treatment team.  Patients discharged the day of surgery will not be allowed to drive home.   Name and phone number of your driver:    Special instructions:  PREPARING FOR SURGERY  Please read over the following fact sheets that you were given. Pain Booklet and Surgical Site Infection Prevention

## 2016-02-04 ENCOUNTER — Encounter (HOSPITAL_COMMUNITY): Payer: Self-pay | Admitting: Certified Registered Nurse Anesthetist

## 2016-02-04 ENCOUNTER — Inpatient Hospital Stay (HOSPITAL_COMMUNITY): Payer: Medicare HMO | Admitting: Certified Registered Nurse Anesthetist

## 2016-02-04 ENCOUNTER — Inpatient Hospital Stay (HOSPITAL_COMMUNITY)
Admission: RE | Admit: 2016-02-04 | Discharge: 2016-02-05 | DRG: 483 | Disposition: A | Discharge status: Home / Self Care | Payer: Medicare HMO | Source: Ambulatory Visit | Attending: Orthopedic Surgery | Admitting: Orthopedic Surgery

## 2016-02-04 ENCOUNTER — Encounter (HOSPITAL_COMMUNITY)
Admission: RE | Disposition: A | Discharge status: Home / Self Care | Payer: Self-pay | Source: Ambulatory Visit | Attending: Orthopedic Surgery

## 2016-02-04 ENCOUNTER — Inpatient Hospital Stay (HOSPITAL_COMMUNITY): Payer: Medicare HMO

## 2016-02-04 DIAGNOSIS — M19011 Primary osteoarthritis, right shoulder: Secondary | ICD-10-CM | POA: Diagnosis present

## 2016-02-04 DIAGNOSIS — Z7983 Long term (current) use of bisphosphonates: Secondary | ICD-10-CM | POA: Diagnosis not present

## 2016-02-04 DIAGNOSIS — Z96611 Presence of right artificial shoulder joint: Secondary | ICD-10-CM

## 2016-02-04 DIAGNOSIS — M75101 Unspecified rotator cuff tear or rupture of right shoulder, not specified as traumatic: Principal | ICD-10-CM | POA: Diagnosis present

## 2016-02-04 DIAGNOSIS — E039 Hypothyroidism, unspecified: Secondary | ICD-10-CM | POA: Diagnosis present

## 2016-02-04 DIAGNOSIS — E7800 Pure hypercholesterolemia, unspecified: Secondary | ICD-10-CM | POA: Diagnosis present

## 2016-02-04 DIAGNOSIS — M25511 Pain in right shoulder: Secondary | ICD-10-CM | POA: Diagnosis present

## 2016-02-04 HISTORY — PX: REVERSE SHOULDER ARTHROPLASTY: SHX5054

## 2016-02-04 SURGERY — ARTHROPLASTY, SHOULDER, TOTAL, REVERSE
Anesthesia: Regional | Site: Shoulder | Laterality: Right

## 2016-02-04 MED ORDER — ACETAMINOPHEN 650 MG RE SUPP: 650.0000 mg | Freq: Four times a day (QID) | RECTAL | Status: DC | PRN

## 2016-02-04 MED ORDER — CEFAZOLIN SODIUM-DEXTROSE 2-4 GM/100ML-% IV SOLN
2.0000 g | INTRAVENOUS | Status: AC
  Administered 2016-02-04: 2 g via INTRAVENOUS
  Filled 2016-02-04: qty 100

## 2016-02-04 MED ORDER — ATORVASTATIN CALCIUM 20 MG PO TABS
20.0000 mg | ORAL_TABLET | Freq: Every evening | ORAL | Status: DC
  Filled 2016-02-04: qty 1

## 2016-02-04 MED ORDER — OXYCODONE HCL 5 MG PO TABS: 5.0000 mg | ORAL_TABLET | Freq: Once | ORAL | Status: DC | PRN

## 2016-02-04 MED ORDER — ACETAMINOPHEN 325 MG PO TABS: 650.0000 mg | ORAL_TABLET | Freq: Four times a day (QID) | ORAL | Status: DC | PRN

## 2016-02-04 MED ORDER — METHOCARBAMOL 1000 MG/10ML IJ SOLN
500.0000 mg | Freq: Four times a day (QID) | INTRAVENOUS | Status: DC | PRN
  Filled 2016-02-04: qty 5

## 2016-02-04 MED ORDER — ONDANSETRON HCL 4 MG/2ML IJ SOLN: 4.0000 mg | Freq: Four times a day (QID) | INTRAMUSCULAR | Status: DC | PRN

## 2016-02-04 MED ORDER — ADULT MULTIVITAMIN W/MINERALS CH
1.0000 | ORAL_TABLET | Freq: Every day | ORAL | Status: DC
  Administered 2016-02-04 – 2016-02-05 (×2): 1 via ORAL
  Filled 2016-02-04 (×2): qty 1

## 2016-02-04 MED ORDER — CHLORHEXIDINE GLUCONATE 4 % EX LIQD: 60.0000 mL | Freq: Once | CUTANEOUS | Status: DC

## 2016-02-04 MED ORDER — LEVOTHYROXINE SODIUM 75 MCG PO TABS
75.0000 ug | ORAL_TABLET | Freq: Every day | ORAL | Status: DC
  Administered 2016-02-05: 75 ug via ORAL
  Filled 2016-02-04: qty 1

## 2016-02-04 MED ORDER — DOCUSATE SODIUM 100 MG PO CAPS
100.0000 mg | ORAL_CAPSULE | Freq: Two times a day (BID) | ORAL | Status: DC
  Administered 2016-02-04 – 2016-02-05 (×3): 100 mg via ORAL
  Filled 2016-02-04 (×3): qty 1

## 2016-02-04 MED ORDER — MORPHINE SULFATE (PF) 2 MG/ML IV SOLN: 2.0000 mg | INTRAVENOUS | Status: DC | PRN

## 2016-02-04 MED ORDER — ONDANSETRON HCL 4 MG/2ML IJ SOLN
INTRAMUSCULAR | Status: DC | PRN
  Administered 2016-02-04: 4 mg via INTRAVENOUS

## 2016-02-04 MED ORDER — ONDANSETRON HCL 4 MG PO TABS: 4.0000 mg | ORAL_TABLET | Freq: Four times a day (QID) | ORAL | Status: DC | PRN

## 2016-02-04 MED ORDER — BUPIVACAINE-EPINEPHRINE (PF) 0.25% -1:200000 IJ SOLN
INTRAMUSCULAR | Status: AC
  Filled 2016-02-04: qty 30

## 2016-02-04 MED ORDER — KETOROLAC TROMETHAMINE 30 MG/ML IJ SOLN
INTRAMUSCULAR | Status: DC | PRN
  Administered 2016-02-04: 15 mg via INTRAVENOUS

## 2016-02-04 MED ORDER — METOCLOPRAMIDE HCL 5 MG/ML IJ SOLN: 5.0000 mg | Freq: Three times a day (TID) | INTRAMUSCULAR | Status: DC | PRN

## 2016-02-04 MED ORDER — LIDOCAINE 2% (20 MG/ML) 5 ML SYRINGE
INTRAMUSCULAR | Status: DC | PRN
  Administered 2016-02-04: 60 mg via INTRAVENOUS

## 2016-02-04 MED ORDER — OCUVITE-LUTEIN PO CAPS
ORAL_CAPSULE | Freq: Every day | ORAL | Status: DC
  Filled 2016-02-04: qty 1

## 2016-02-04 MED ORDER — BUPIVACAINE-EPINEPHRINE (PF) 0.5% -1:200000 IJ SOLN
INTRAMUSCULAR | Status: DC | PRN
  Administered 2016-02-04: 30 mL via PERINEURAL

## 2016-02-04 MED ORDER — FENTANYL CITRATE (PF) 100 MCG/2ML IJ SOLN
INTRAMUSCULAR | Status: DC | PRN
  Administered 2016-02-04: 50 ug via INTRAVENOUS
  Administered 2016-02-04 (×2): 25 ug via INTRAVENOUS

## 2016-02-04 MED ORDER — HYDROCODONE-ACETAMINOPHEN 5-325 MG PO TABS
1.0000 | ORAL_TABLET | ORAL | Status: DC | PRN
  Administered 2016-02-04 – 2016-02-05 (×5): 2 via ORAL
  Filled 2016-02-04 (×5): qty 2

## 2016-02-04 MED ORDER — MIDAZOLAM HCL 5 MG/5ML IJ SOLN
INTRAMUSCULAR | Status: DC | PRN
  Administered 2016-02-04: 1 mg via INTRAVENOUS

## 2016-02-04 MED ORDER — METOCLOPRAMIDE HCL 5 MG PO TABS: 5.0000 mg | ORAL_TABLET | Freq: Three times a day (TID) | ORAL | Status: DC | PRN

## 2016-02-04 MED ORDER — PROPOFOL 10 MG/ML IV BOLUS
INTRAVENOUS | Status: DC | PRN
  Administered 2016-02-04: 120 mg via INTRAVENOUS

## 2016-02-04 MED ORDER — SUGAMMADEX SODIUM 200 MG/2ML IV SOLN
INTRAVENOUS | Status: DC | PRN
  Administered 2016-02-04: 50 mg via INTRAVENOUS

## 2016-02-04 MED ORDER — SODIUM CHLORIDE 0.9 % IV SOLN
INTRAVENOUS | Status: DC
  Administered 2016-02-04: 12:00:00 via INTRAVENOUS

## 2016-02-04 MED ORDER — CEFAZOLIN IN D5W 1 GM/50ML IV SOLN
1.0000 g | Freq: Four times a day (QID) | INTRAVENOUS | Status: AC
  Administered 2016-02-04 – 2016-02-05 (×3): 1 g via INTRAVENOUS
  Filled 2016-02-04 (×3): qty 50

## 2016-02-04 MED ORDER — IBUPROFEN 200 MG PO TABS
600.0000 mg | ORAL_TABLET | Freq: Every day | ORAL | Status: DC | PRN
  Administered 2016-02-05: 600 mg via ORAL
  Filled 2016-02-04: qty 3

## 2016-02-04 MED ORDER — OXYCODONE HCL 5 MG/5ML PO SOLN: 5.0000 mg | Freq: Once | ORAL | Status: DC | PRN

## 2016-02-04 MED ORDER — MAGNESIUM OXIDE 400 (241.3 MG) MG PO TABS
200.0000 mg | ORAL_TABLET | Freq: Every day | ORAL | Status: DC
  Administered 2016-02-05: 200 mg via ORAL
  Filled 2016-02-04 (×2): qty 1

## 2016-02-04 MED ORDER — FENTANYL CITRATE (PF) 100 MCG/2ML IJ SOLN
INTRAMUSCULAR | Status: AC
  Filled 2016-02-04: qty 2

## 2016-02-04 MED ORDER — PHENOL 1.4 % MT LIQD: 1.0000 | OROMUCOSAL | Status: DC | PRN

## 2016-02-04 MED ORDER — MENTHOL 3 MG MT LOZG: 1.0000 | LOZENGE | OROMUCOSAL | Status: DC | PRN

## 2016-02-04 MED ORDER — VITAMIN D3 25 MCG (1000 UNIT) PO TABS
2000.0000 [IU] | ORAL_TABLET | Freq: Every day | ORAL | Status: DC
  Administered 2016-02-05: 2000 [IU] via ORAL
  Filled 2016-02-04 (×2): qty 2

## 2016-02-04 MED ORDER — METHOCARBAMOL 500 MG PO TABS
500.0000 mg | ORAL_TABLET | Freq: Four times a day (QID) | ORAL | Status: DC | PRN
  Administered 2016-02-05 (×2): 500 mg via ORAL
  Filled 2016-02-04 (×3): qty 1

## 2016-02-04 MED ORDER — ROCURONIUM BROMIDE 10 MG/ML (PF) SYRINGE
PREFILLED_SYRINGE | INTRAVENOUS | Status: DC | PRN
  Administered 2016-02-04: 30 mg via INTRAVENOUS

## 2016-02-04 MED ORDER — MIDAZOLAM HCL 2 MG/2ML IJ SOLN
INTRAMUSCULAR | Status: AC
  Filled 2016-02-04: qty 2

## 2016-02-04 MED ORDER — PHENYLEPHRINE HCL 10 MG/ML IJ SOLN
INTRAVENOUS | Status: DC | PRN
  Administered 2016-02-04: 20 ug/min via INTRAVENOUS

## 2016-02-04 MED ORDER — FENTANYL CITRATE (PF) 100 MCG/2ML IJ SOLN: 25.0000 ug | INTRAMUSCULAR | Status: DC | PRN

## 2016-02-04 MED ORDER — 0.9 % SODIUM CHLORIDE (POUR BTL) OPTIME
TOPICAL | Status: DC | PRN
  Administered 2016-02-04: 1000 mL

## 2016-02-04 MED ORDER — BUPIVACAINE-EPINEPHRINE 0.25% -1:200000 IJ SOLN
INTRAMUSCULAR | Status: DC | PRN
  Administered 2016-02-04: 9 mL

## 2016-02-04 MED ORDER — LACTATED RINGERS IV SOLN
INTRAVENOUS | Status: DC | PRN
  Administered 2016-02-04 (×2): via INTRAVENOUS

## 2016-02-04 MED ORDER — EPHEDRINE SULFATE-NACL 50-0.9 MG/10ML-% IV SOSY
PREFILLED_SYRINGE | INTRAVENOUS | Status: DC | PRN
  Administered 2016-02-04: 2.5 mg via INTRAVENOUS
  Administered 2016-02-04: 5 mg via INTRAVENOUS
  Administered 2016-02-04 (×5): 2.5 mg via INTRAVENOUS
  Administered 2016-02-04: 5 mg via INTRAVENOUS

## 2016-02-04 MED ORDER — SODIUM CHLORIDE 0.9 % IR SOLN: Status: DC | PRN

## 2016-02-04 MED ORDER — POLYETHYLENE GLYCOL 3350 17 G PO PACK: 17.0000 g | PACK | Freq: Every day | ORAL | Status: DC | PRN

## 2016-02-04 MED ORDER — PROPOFOL 10 MG/ML IV BOLUS
INTRAVENOUS | Status: AC
  Filled 2016-02-04: qty 40

## 2016-02-04 SURGICAL SUPPLY — 67 items
BIT DRILL 5/64X5 DISP (BIT) ×2 IMPLANT
BLADE SAG 18X100X1.27 (BLADE) ×2 IMPLANT
CAPT SHLDR REVTOTAL 1 ×1 IMPLANT
CEMENT BONE DEPUY (Cement) ×1 IMPLANT
COVER SURGICAL LIGHT HANDLE (MISCELLANEOUS) ×2 IMPLANT
DRAPE IMP U-DRAPE 54X76 (DRAPES) ×4 IMPLANT
DRAPE INCISE IOBAN 66X45 STRL (DRAPES) ×3 IMPLANT
DRAPE ORTHO SPLIT 77X108 STRL (DRAPES) ×4
DRAPE SURG ORHT 6 SPLT 77X108 (DRAPES) ×2 IMPLANT
DRAPE U-SHAPE 47X51 STRL (DRAPES) ×2 IMPLANT
DRAPE X-RAY CASS 24X20 (DRAPES) IMPLANT
DRSG ADAPTIC 3X8 NADH LF (GAUZE/BANDAGES/DRESSINGS) ×2 IMPLANT
DRSG PAD ABDOMINAL 8X10 ST (GAUZE/BANDAGES/DRESSINGS) ×2 IMPLANT
DURAPREP 26ML APPLICATOR (WOUND CARE) ×2 IMPLANT
ELECT BLADE 4.0 EZ CLEAN MEGAD (MISCELLANEOUS) ×2
ELECT NDL TIP 2.8 STRL (NEEDLE) ×1 IMPLANT
ELECT NEEDLE TIP 2.8 STRL (NEEDLE) ×2 IMPLANT
ELECT REM PT RETURN 9FT ADLT (ELECTROSURGICAL) ×2
ELECTRODE BLDE 4.0 EZ CLN MEGD (MISCELLANEOUS) ×1 IMPLANT
ELECTRODE REM PT RTRN 9FT ADLT (ELECTROSURGICAL) ×1 IMPLANT
GAUZE SPONGE 4X4 12PLY STRL (GAUZE/BANDAGES/DRESSINGS) ×2 IMPLANT
GLOVE BIOGEL PI ORTHO PRO 7.5 (GLOVE) ×1
GLOVE BIOGEL PI ORTHO PRO SZ8 (GLOVE) ×1
GLOVE ORTHO TXT STRL SZ7.5 (GLOVE) ×2 IMPLANT
GLOVE PI ORTHO PRO STRL 7.5 (GLOVE) ×1 IMPLANT
GLOVE PI ORTHO PRO STRL SZ8 (GLOVE) ×1 IMPLANT
GLOVE SURG ORTHO 8.5 STRL (GLOVE) ×2 IMPLANT
GLOVE SURG SS PI 6.5 STRL IVOR (GLOVE) ×2 IMPLANT
GOWN STRL REUS W/ TWL LRG LVL3 (GOWN DISPOSABLE) ×1 IMPLANT
GOWN STRL REUS W/ TWL XL LVL3 (GOWN DISPOSABLE) ×2 IMPLANT
GOWN STRL REUS W/TWL LRG LVL3 (GOWN DISPOSABLE) ×2
GOWN STRL REUS W/TWL XL LVL3 (GOWN DISPOSABLE) ×4
HANDPIECE INTERPULSE COAX TIP (DISPOSABLE)
KIT BASIN OR (CUSTOM PROCEDURE TRAY) ×2 IMPLANT
KIT ROOM TURNOVER OR (KITS) ×2 IMPLANT
MANIFOLD NEPTUNE II (INSTRUMENTS) ×2 IMPLANT
NDL 1/2 CIR MAYO (NEEDLE) ×1 IMPLANT
NDL HYPO 25GX1X1/2 BEV (NEEDLE) ×1 IMPLANT
NEEDLE 1/2 CIR MAYO (NEEDLE) ×2 IMPLANT
NEEDLE HYPO 25GX1X1/2 BEV (NEEDLE) ×2 IMPLANT
NS IRRIG 1000ML POUR BTL (IV SOLUTION) ×2 IMPLANT
PACK SHOULDER (CUSTOM PROCEDURE TRAY) ×2 IMPLANT
PAD ARMBOARD 7.5X6 YLW CONV (MISCELLANEOUS) ×4 IMPLANT
SET HNDPC FAN SPRY TIP SCT (DISPOSABLE) IMPLANT
SLING ARM LRG ADULT FOAM STRAP (SOFTGOODS) IMPLANT
SLING ARM MED ADULT FOAM STRAP (SOFTGOODS) IMPLANT
SPONGE LAP 18X18 X RAY DECT (DISPOSABLE) IMPLANT
SPONGE LAP 4X18 X RAY DECT (DISPOSABLE) ×2 IMPLANT
STRIP CLOSURE SKIN 1/2X4 (GAUZE/BANDAGES/DRESSINGS) ×2 IMPLANT
SUCTION FRAZIER HANDLE 10FR (MISCELLANEOUS) ×1
SUCTION TUBE FRAZIER 10FR DISP (MISCELLANEOUS) ×1 IMPLANT
SUT FIBERWIRE #2 38 T-5 BLUE (SUTURE) ×4
SUT MNCRL AB 4-0 PS2 18 (SUTURE) ×2 IMPLANT
SUT VIC AB 0 CT1 27 (SUTURE) ×2
SUT VIC AB 0 CT1 27XBRD ANBCTR (SUTURE) IMPLANT
SUT VIC AB 0 CT2 27 (SUTURE) ×1 IMPLANT
SUT VIC AB 2-0 CT1 27 (SUTURE) ×2
SUT VIC AB 2-0 CT1 TAPERPNT 27 (SUTURE) ×1 IMPLANT
SUT VICRYL 0 CT 1 36IN (SUTURE) ×2 IMPLANT
SUTURE FIBERWR #2 38 T-5 BLUE (SUTURE) ×2 IMPLANT
SYR CONTROL 10ML LL (SYRINGE) ×2 IMPLANT
TOWEL OR 17X24 6PK STRL BLUE (TOWEL DISPOSABLE) ×2 IMPLANT
TOWEL OR 17X26 10 PK STRL BLUE (TOWEL DISPOSABLE) ×2 IMPLANT
TOWER CARTRIDGE SMART MIX (DISPOSABLE) ×1 IMPLANT
TRAY FOLEY CATH 16FRSI W/METER (SET/KITS/TRAYS/PACK) IMPLANT
WATER STERILE IRR 1000ML POUR (IV SOLUTION) ×2 IMPLANT
YANKAUER SUCT BULB TIP NO VENT (SUCTIONS) ×2 IMPLANT

## 2016-02-04 NOTE — Brief Op Note (Signed)
02/04/2016  9:51 AM  PATIENT:  Leda Quail Bolger  82 y.o. female  PRE-OPERATIVE DIAGNOSIS:  RIGHT SHOULDER ROTATOR UNSUFFINCENCY WITH TEARS AND ARTHORPATHY  POST-OPERATIVE DIAGNOSIS:  RIGHT SHOULDER ROTATOR UNSUFFINCENCY WITH TEARS AND ARTHROPATHY  PROCEDURE:  Procedure(s): REVERSE SHOULDER ARTHROPLASTY (Right) DePuy Delta Xtend  SURGEON:  Surgeon(s) and Role:    * [name], MD - Primary  PHYSICIAN ASSISTANT:   ASSISTANTS: Ventura Bruns, PA-C   ANESTHESIA:   local and general  EBL:  Total I/O In: 950 [I.V.:950] Out: 60 [Blood:60]  BLOOD ADMINISTERED:none  DRAINS: none   LOCAL MEDICATIONS USED:  MARCAINE     SPECIMEN:  No Specimen  DISPOSITION OF SPECIMEN:  N/A  COUNTS:  YES  TOURNIQUET:  * No tourniquets in log *  DICTATION: .Other Dictation: Dictation Number [id number]  PLAN OF CARE: Admit to inpatient   PATIENT DISPOSITION:  PACU - hemodynamically stable.   Delay start of Pharmacological VTE agent (>24hrs) due to surgical blood loss or risk of bleeding: no

## 2016-02-04 NOTE — Anesthesia Preprocedure Evaluation (Addendum)
Anesthesia Evaluation  Patient identified by MRN, date of birth, ID band Patient awake    Reviewed: Allergy & Precautions, H&P , NPO status , Patient's Chart, lab work & pertinent test results  Airway Mallampati: II   Neck ROM: full    Dental  (+) Teeth Intact, Dental Advisory Given   Pulmonary neg pulmonary ROS,    breath sounds clear to auscultation       Cardiovascular negative cardio ROS   Rhythm:regular Rate:Normal     Neuro/Psych    GI/Hepatic   Endo/Other  Hypothyroidism   Renal/GU      Musculoskeletal  (+) Arthritis ,   Abdominal   Peds  Hematology   Anesthesia Other Findings   Reproductive/Obstetrics                            Anesthesia Physical Anesthesia Plan  ASA: II  Anesthesia Plan: General and Regional   Post-op Pain Management:  Regional for Post-op pain   Induction: Intravenous  Airway Management Planned: Oral ETT  Additional Equipment:   Intra-op Plan:   Post-operative Plan: Extubation in OR  Informed Consent: I have reviewed the patients History and Physical, chart, labs and discussed the procedure including the risks, benefits and alternatives for the proposed anesthesia with the patient or authorized representative who has indicated his/her understanding and acceptance.     Plan Discussed with: CRNA, Anesthesiologist and Surgeon  Anesthesia Plan Comments:         Anesthesia Quick Evaluation

## 2016-02-04 NOTE — Anesthesia Postprocedure Evaluation (Signed)
Anesthesia Post Note  Patient: Courtney Boyer  Procedure(s) Performed: Procedure(s) (LRB): REVERSE SHOULDER ARTHROPLASTY (Right)  Patient location during evaluation: PACU Anesthesia Type: General Level of consciousness: awake and alert and patient cooperative Pain management: pain level controlled Vital Signs Assessment: post-procedure vital signs reviewed and stable Respiratory status: spontaneous breathing and respiratory function stable Cardiovascular status: stable Anesthetic complications: no       Last Vitals:  Vitals:   02/04/16 1051 02/04/16 1143  BP:  (!) 117/56  Pulse: (!) 59 63  Resp: 13 15  Temp: 36.2 C 36.1 C    Last Pain:  Vitals:   02/04/16 1509  TempSrc:   PainSc: [place]

## 2016-02-04 NOTE — Interval H&P Note (Signed)
History and Physical Interval Note:  02/04/2016 7:33 AM  Courtney Boyer  has presented today for surgery, with the diagnosis of RIGHT SHOULDER ROTATOR UNSUFFINCENCY WITH TEARS AND ARTHROTOMY  The various methods of treatment have been discussed with the patient and family. After consideration of risks, benefits and other options for treatment, the patient has consented to  Procedure(s): REVERSE SHOULDER ARTHROPLASTY (Right) as a surgical intervention .  The patient's history has been reviewed, patient examined, no change in status, stable for surgery.  I have reviewed the patient's chart and labs.  Questions were answered to the patient's satisfaction.     Karem Farha,STEVEN R

## 2016-02-04 NOTE — Anesthesia Procedure Notes (Signed)
Anesthesia Regional Block:  Interscalene brachial plexus block  Pre-Anesthetic Checklist: ,, timeout performed, Correct Patient, Correct Site, Correct Laterality, Correct Procedure, Correct Position, site marked, Risks and benefits discussed,  Surgical consent,  Pre-op evaluation,  At surgeon's request and post-op pain management  Laterality: Right  Prep: chloraprep       Needles:  Injection technique: Single-shot  Needle Type: Echogenic Stimulator Needle     Needle Length: 5cm 5 cm Needle Gauge: 22 and 22 G    Additional Needles:  Procedures: ultrasound guided (picture in chart) and nerve stimulator Interscalene brachial plexus block  Nerve Stimulator or Paresthesia:  Response: biceps flexion, 0.45 mA,   Additional Responses:   Narrative:  Start time: 02/04/2016 7:09 AM End time: 02/04/2016 7:17 AM Injection made incrementally with aspirations every 5 mL.  Performed by: Personally  Anesthesiologist: Jdyn Parkerson  Additional Notes: Functioning IV was confirmed and monitors were applied.  A 42mm 22ga Arrow echogenic stimulator needle was used. Sterile prep and drape,hand hygiene and sterile gloves were used.  Negative aspiration and negative test dose prior to incremental administration of local anesthetic. The patient tolerated the procedure well.  Ultrasound guidance: relevent anatomy identified, needle position confirmed, local anesthetic spread visualized around nerve(s), vascular puncture avoided.  Image printed for medical record.

## 2016-02-04 NOTE — Anesthesia Procedure Notes (Signed)
Procedure Name: Intubation Date/Time: 02/04/2016 8:03 AM Performed by: Merdis Delay Pre-anesthesia Checklist: Emergency Drugs available, Patient being monitored, Timeout performed, Suction available and Patient identified Patient Re-evaluated:Patient Re-evaluated prior to inductionOxygen Delivery Method: Circle system utilized Preoxygenation: Pre-oxygenation with 100% oxygen Intubation Type: IV induction Laryngoscope Size: Mac and 3 Grade View: Grade I Tube type: Oral Tube size: 7.0 mm Number of attempts: 1 Airway Equipment and Method: Stylet Placement Confirmation: ETT inserted through vocal cords under direct vision,  positive ETCO2,  CO2 detector and breath sounds checked- equal and bilateral Secured at: 22 cm Tube secured with: Tape Dental Injury: Teeth and Oropharynx as per pre-operative assessment

## 2016-02-04 NOTE — Transfer of Care (Signed)
Immediate Anesthesia Transfer of Care Note  Patient: Courtney Boyer  Procedure(s) Performed: Procedure(s): REVERSE SHOULDER ARTHROPLASTY (Right)  Patient Location: PACU  Anesthesia Type:General  Level of Consciousness: awake, alert  and oriented  Airway & Oxygen Therapy: Patient Spontanous Breathing  Post-op Assessment: Report given to RN and Post -op Vital signs reviewed and stable  Post vital signs: Reviewed and stable  Last Vitals:  Vitals:   02/04/16 0600  BP: (!) 147/65  Pulse: 70  Resp: 18  Temp: 36.4 C    Last Pain:  Vitals:   02/04/16 0600  TempSrc: Oral  PainSc: 0-No pain      Patients Stated Pain Goal: 3 ([id number])  Complications: No apparent anesthesia complications

## 2016-02-05 ENCOUNTER — Encounter (HOSPITAL_COMMUNITY): Payer: Self-pay | Admitting: Orthopedic Surgery

## 2016-02-05 LAB — BASIC METABOLIC PANEL
ANION GAP: 7 (ref 5–15)
BUN: 12 mg/dL (ref 6–20)
CO2: 27 mmol/L (ref 22–32)
Calcium: 8.2 mg/dL — ABNORMAL LOW (ref 8.9–10.3)
Chloride: 102 mmol/L (ref 101–111)
Creatinine, Ser: 0.82 mg/dL (ref 0.44–1.00)
GFR calc Af Amer: >60 mL/min (ref >60)
Glucose, Bld: 101 mg/dL — ABNORMAL HIGH (ref 65–99)
POTASSIUM: 4.2 mmol/L (ref 3.5–5.1)
SODIUM: 136 mmol/L (ref 135–145)

## 2016-02-05 LAB — HEMOGLOBIN AND HEMATOCRIT, BLOOD
HEMATOCRIT: 29.9 % — AB (ref 36.0–46.0)
Hemoglobin: 10.1 g/dL — ABNORMAL LOW (ref 12.0–15.0)

## 2016-02-05 MED ORDER — HYDROCODONE-ACETAMINOPHEN 5-325 MG PO TABS: 1.0000 | ORAL_TABLET | ORAL | 0 refills | Status: DC | PRN

## 2016-02-05 MED ORDER — METHOCARBAMOL 500 MG PO TABS: 500.0000 mg | ORAL_TABLET | Freq: Four times a day (QID) | ORAL | 0 refills | Status: DC | PRN

## 2016-02-05 MED ORDER — PROSIGHT PO TABS
1.0000 | ORAL_TABLET | Freq: Every day | ORAL | Status: DC
  Administered 2016-02-05: 1 via ORAL
  Filled 2016-02-05: qty 1

## 2016-02-05 NOTE — Progress Notes (Signed)
PT Cancellation Note  Patient Details Name: Courtney Boyer MRN: [id number] DOB: [birth year]   Cancelled Treatment:    Reason Eval/Treat Not Completed: PT screened, no needs identified, will sign off. Per OT, pt does not have PT needs as she is independent with transfers and ambulation.    [name] 02/05/2016, 10:58 AM  [phone]

## 2016-02-05 NOTE — Evaluation (Signed)
Occupational Therapy Evaluation Patient Details Name: Courtney Boyer MRN: [id number] DOB: 1933/10/09 Today's Date: 02/05/2016    History of Present Illness Pt is an 82 y.o. female s/p R reverse total shoulder arthroplasty. Pt has a PMH significant for but not limited to arthritis, high cholesterol, history of anemia, and hypothyroidism. Also has past surgical history including L shoulder arthroscopy with subacromial decompression and mini open rotator cuff repair.   Clinical Impression   PTA, pt was independent with ADL and IADL and functional mobility. Pt currently requires mod assist for UB ADL and min assist for LB ADL as well as min guard assist for safety with functional mobility. Pt will have 24 hour assistance at home from granddaughter and daughter. Pt would benefit from continued OT services while admitted to reinforce education concerning shoulder active protocol, ADL techniques, and HEP. OT will continue to follow acutely. Recommend progression of shoulder rehab per MD.     Follow Up Recommendations  No OT follow up;Other (comment) (Follow up per MD)    Equipment Recommendations  None recommended by OT    Recommendations for Other Services       Precautions / Restrictions Precautions Precautions: Shoulder Type of Shoulder Precautions: Active protocol: sling for comfort and sleep, NWB RUE, AROm elbow/wrist/hand, AROM/PROM shoulder (forward flexion: 0-90, external rotation 0-30, abduction: 0-60) Shoulder Interventions: Shoulder sling/immobilizer Precaution Booklet Issued: Yes (comment) Precaution Comments: Reviewed handout Required Braces or Orthoses: Sling (R shoulder) Restrictions Weight Bearing Restrictions: Yes RUE Weight Bearing: Non weight bearing      Mobility Bed Mobility Overal bed mobility: Needs Assistance Bed Mobility: Supine to Sit     Supine to sit: Supervision        Transfers Overall transfer level: Needs assistance   Transfers: Sit  to/from Stand Sit to Stand: Supervision              Balance Overall balance assessment: Needs assistance Sitting-balance support: No upper extremity supported;Feet supported Sitting balance-Leahy Scale: Good     Standing balance support: No upper extremity supported;During functional activity Standing balance-Leahy Scale: Good                              ADL Overall ADL's : Needs assistance/impaired Eating/Feeding: Sitting;Set up   Grooming: Minimal assistance;Sitting   Upper Body Bathing: Moderate assistance;Sitting   Lower Body Bathing: Minimal assistance;Sit to/from stand   Upper Body Dressing : Moderate assistance;Sitting   Lower Body Dressing: Minimal assistance;Sit to/from stand   Toilet Transfer: Min guard;Ambulation   Toileting- Clothing Manipulation and Hygiene: Minimal assistance   Tub/ Shower Transfer: Min guard;Ambulation;Walk-in shower   Functional mobility during ADLs: Min guard General ADL Comments: Began education on ADL techniques with plan to review prior to D/C.      Vision Vision Assessment?: No apparent visual deficits   Perception     Praxis      Pertinent Vitals/Pain Pain Assessment: 0-10 Pain Score: 3  Pain Location: R shoulder Pain Descriptors / Indicators: Sore;Operative site guarding     Hand Dominance Right   Extremity/Trunk Assessment Upper Extremity Assessment Upper Extremity Assessment: RUE deficits/detail RUE Deficits / Details: Decreased strength and ROM as expected post-operatively. RUE: Unable to fully assess due to pain;Unable to fully assess due to immobilization   Lower Extremity Assessment Lower Extremity Assessment: Overall WFL for tasks assessed       Communication Communication Communication: No difficulties   Cognition Arousal/Alertness: Awake/alert Behavior During  Therapy: WFL for tasks assessed/performed Overall Cognitive Status: Within Functional Limits for tasks assessed                      General Comments       Exercises Exercises: Shoulder (Plan to return for second session to review active protocol)     Shoulder Instructions Shoulder Instructions Donning/doffing shirt without moving shoulder: Moderate assistance Method for sponge bathing under operated UE: Moderate assistance Donning/doffing sling/immobilizer: Moderate assistance Correct positioning of sling/immobilizer: Supervision/safety Sling wearing schedule (on at all times/off for ADL's): Supervision/safety Proper positioning of operated UE when showering: Supervision/safety Positioning of UE while sleeping: Webber expects to be discharged to:: Private residence Living Arrangements: Alone Available Help at Discharge: Family;Available 24 hours/day Type of Home: House Home Access: Stairs to enter [employer] of Steps: 4 in front and 2 in back Entrance Stairs-Rails: Right;Left Home Layout: Two level;Able to live on main level with bedroom/bathroom     Bathroom Shower/Tub: Occupational psychologist: Standard     Home Equipment: Grab bars - tub/shower;Shower seat          Prior Functioning/Environment Level of Independence: Independent                 OT Problem List: Decreased strength;Decreased range of motion;Impaired balance (sitting and/or standing);Decreased safety awareness;Decreased knowledge of use of DME or AE;Decreased knowledge of precautions;Pain;Impaired UE functional use   OT Treatment/Interventions: Self-care/ADL training;Therapeutic exercise;DME and/or AE instruction;Therapeutic activities;Patient/family education;Balance training    OT Goals(Current goals can be found in the care plan section) Acute Rehab OT Goals Patient Stated Goal: go home OT Goal Formulation: With patient Time For Goal Achievement: 02/12/16 Potential to Achieve Goals: Good ADL Goals Pt Will Perform Upper Body Dressing: with  min guard assist;sitting Pt Will Transfer to Toilet: with modified independence;ambulating Pt Will Perform Tub/Shower Transfer: with modified independence;ambulating;shower seat;Shower transfer Pt/caregiver will Perform Home Exercise Program: Right Upper extremity;Increased ROM;With written HEP provided (shoulder active protocol per MD)  OT Frequency: Min 2X/week   Barriers to D/C:            Co-evaluation              End of Session Equipment Utilized During Treatment: Gait belt (R shoulder sling)  Activity Tolerance: Patient tolerated treatment well Patient left: in chair;with call bell/phone within reach   Time: 0746-0800 OT Time Calculation (min): 14 min Charges:  OT General Charges $OT Visit: 1 Procedure OT Evaluation $OT Eval Moderate Complexity: 1 Procedure  Norman Herrlich, OTR/L [phone] 02/05/2016, 9:55 AM

## 2016-02-05 NOTE — Discharge Summary (Signed)
Physician Discharge Summary   Patient ID: Courtney Boyer MRN: [id number] DOB/AGE: 80-Jun-1935 80 y.o.  Admit date: 02/04/2016 Discharge date: 02/05/2016  Admission Diagnoses:  Active Problems:   S/P shoulder replacement, right   Discharge Diagnoses:  Same   Surgeries: Procedure(s): REVERSE SHOULDER ARTHROPLASTY on 02/04/2016   Consultants: PT/OT  Discharged Condition: Stable  Hospital Course: Courtney Boyer is an 82 y.o. female who was admitted 02/04/2016 with a chief complaint of right shoulder pain, and found to have a diagnosis of right shoulder cuff arthropathy.  They were brought to the operating room on 02/04/2016 and underwent the above named procedures.    The patient had an uncomplicated hospital course and was stable for discharge.  Recent vital signs:  Vitals:   02/05/16 0029 02/05/16 0534  BP: (!) 103/50 (!) 100/52  Pulse: 67 61  Resp: 16 16  Temp: 98.5 F (36.9 C) 98 F (36.7 C)    Recent laboratory studies:  Results for orders placed or performed during the hospital encounter of 02/04/16  Hemoglobin and hematocrit, blood  Result Value Ref Range   Hemoglobin 10.1 (L) 12.0 - 15.0 g/dL   HCT 29.9 (L) 36.0 - [id number] %  Basic metabolic panel  Result Value Ref Range   Sodium 136 135 - 145 mmol/L   Potassium 4.2 3.5 - 5.1 mmol/L   Chloride 102 101 - 111 mmol/L   CO2 27 22 - 32 mmol/L   Glucose, Bld 101 (H) 65 - 99 mg/dL   BUN 12 6 - 20 mg/dL   Creatinine, Ser 0.82 0.44 - 1.00 mg/dL   Calcium 8.2 (L) 8.9 - 10.3 mg/dL   GFR calc non Af Amer >60 >60 mL/min   GFR calc Af Amer >60 >60 mL/min   Anion gap 7 5 - 15    Discharge Medications:   Allergies as of 02/05/2016      Reactions   No Known Allergies       Medication List    TAKE these medications   atorvastatin 40 MG tablet Commonly known as:  LIPITOR Take 20 mg by mouth every evening.   HYDROcodone-acetaminophen 5-325 MG tablet Commonly known as:  NORCO/VICODIN Take 1-2 tablets by mouth  every 4 (four) hours as needed (breakthrough pain).   ibuprofen 600 MG tablet Commonly known as:  ADVIL,MOTRIN Take 600 mg by mouth daily as needed for pain.   levothyroxine 75 MCG tablet Commonly known as:  SYNTHROID, LEVOTHROID Take 75 mcg by mouth daily before breakfast.   Magnesium 250 MG Tabs Take 250 mg by mouth daily after breakfast.   methocarbamol 500 MG tablet Commonly known as:  ROBAXIN Take 1 tablet (500 mg total) by mouth every 6 (six) hours as needed for muscle spasms.   multivitamin with minerals Tabs tablet Take 1 tablet by mouth daily. CENTRUM SILVER ULTRA WOMENS   PRESERVISION AREDS 2 PO Take 1 capsule by mouth daily after breakfast.   Vitamin D3 2000 units Tabs Take 2,000 Units by mouth daily after breakfast.       Diagnostic Studies: Dg Shoulder Right Port  Result Date: 02/04/2016 CLINICAL DATA:  Shoulder replacement. EXAM: PORTABLE RIGHT SHOULDER COMPARISON:  No recent prior. FINDINGS: Total right shoulder replacement. Hardware intact. Anatomic alignment on one view. Diffuse degenerative change. IMPRESSION: The right shoulder replacement with anatomic alignment on one view. Electronically Signed   By: Marcello Moores  Register   On: 02/04/2016 10:12    Disposition: 01-Home or Self Care  Discharge Instructions  Call MD / Call 911    Complete by:  As directed    If you experience chest pain or shortness of breath, CALL 911 and be transported to the hospital emergency room.  If you develope a fever above 101 F, pus (white drainage) or increased drainage or redness at the wound, or calf pain, call your surgeon's office.   Constipation Prevention    Complete by:  As directed    Drink plenty of fluids.  Prune juice may be helpful.  You may use a stool softener, such as Colace (over the counter) 100 mg twice a day.  Use MiraLax (over the counter) for constipation as needed.   Diet - low sodium heart healthy    Complete by:  As directed    Increase activity slowly  as tolerated    Complete by:  As directed          Signed: Hamna Asa B 02/05/2016, 9:16 AM

## 2016-02-05 NOTE — Progress Notes (Signed)
   Subjective: 1 Day Post-Op Procedure(s) (LRB): REVERSE SHOULDER ARTHROPLASTY (Right)  Pt doing well s/p surgery yesterday Pain is mild  Ready to d/c home Patient reports pain as mild.  Objective:   VITALS:   Vitals:   02/05/16 0029 02/05/16 0534  BP: (!) 103/50 (!) 100/52  Pulse: 67 61  Resp: 16 16  Temp: 98.5 F (36.9 C) 98 F (36.7 C)    Right shoulder dressing intact nv intact distally No rashes or edema  LABS  Recent Labs  02/05/16 0537  HGB 10.1*  HCT 29.9*     Recent Labs  02/05/16 0537  NA 136  K 4.2  BUN 12  CREATININE 0.82  GLUCOSE 101*     Assessment/Plan: 1 Day Post-Op Procedure(s) (LRB): REVERSE SHOULDER ARTHROPLASTY (Right) D/c home today F/u in 2 weeks Overall doing well    Merla Riches, MPAS, PA-C  02/05/2016, 9:12 AM

## 2016-02-05 NOTE — Op Note (Signed)
NAMEMarland Boyer  Courtney, Boyer             ACCOUNT NO.:  [id number]  MEDICAL RECORD NO.:  [id number]  LOCATION:                                 FACILITY:  PHYSICIAN:  Doran Heater. Veverly Fells, M.D. DATE OF BIRTH:  [birth year]  DATE OF PROCEDURE:  02/04/2016 DATE OF DISCHARGE:  02/05/2016                              OPERATIVE REPORT   PREOPERATIVE DIAGNOSIS:  Right shoulder rotator cuff tear arthropathy.  POSTOPERATIVE DIAGNOSIS:  Right shoulder rotator cuff tear arthropathy.  PROCEDURE PERFORMED:  Right reverse total shoulder arthroplasty using DePuy Delta Xtend prosthesis.  SURGEON:  Doran Heater. Veverly Fells, M.D.  ASSISTANT:  Abbott Pao. Dixon, PA-C, who scrubbed the entire procedure and necessary for satisfactory completion of the surgery.  ANESTHESIA:  General anesthesia was used plus interscalene block.  ESTIMATED BLOOD LOSS:  150 mL.  FLUID REPLACED:  1200 mL crystalloid.  INSTRUMENT COUNTS:  Correct.  COMPLICATIONS:  There were no complications.  ANTIBIOTICS:  Perioperative antibiotics were given.  INDICATIONS:  The patient is an 82 year old female with worsening right shoulder pain secondary to massive rotator cuff tears and significant superior head migration and early rotator cuff tear arthropathy.  The patient has had a failure of conservative management including modification of activity, pain medications, injections, and presents now for operative treatment to restore function and eliminate pain to her shoulder.  Informed consent obtained.  DESCRIPTION OF PROCEDURE:  After adequate level of anesthesia achieved, the patient was positioned in the modified beach-chair position.  Right shoulder correctly identified and sterilely prepped and draped in usual manner.  Time-out was called.  We initiated shoulder surgery using standard deltopectoral incision started at the coracoid process extending down to the anterior humerus.  Dissection down through subcutaneous tissue using the  Bovie.  The cephalic vein was identified and taken laterally with the deltoid, pectoralis taken medially, conjoint tendon identified, and retracted medially.  The biceps was tenodesed in situ using a 0 Vicryl figure-of-eight suture into the pectoralis tendon and into the soft tissues of the bicipital sheath. Once that was completed, we went ahead and released the remnant of the subscapularis, which was in poor condition.  We did tag that for retraction to protect the axillary nerve, progressively externally rotating the humerus and releasing the inferior capsule off the humerus. We then extended the shoulder, cut the biceps, and the remnant of the supraspinatus.  She had one little strap of the supraspinatus that we released, and then there was really no remaining supra or infraspinatus at all.  Teres minor seemed to be partially intact at least and we left that.  We entered the proximal humerus with a 6-mm reamer, reamed up to a size 10 mm.  We then went ahead and placed 10 mm head resection guide on and resected 10 degrees retroversion with the oscillating saw.  We removed excess bone spurs with the rongeur.  We then went ahead and used our metaphyseal reamer to ream for the epi-1 right metaphyseal component for the DePuy Delta Xtend prosthesis.  Once we had that metaphyseal reaming completed then we inserted the trial component, which was a 10 body with the epi-1 right, set on the 0 setting, and placed in  10 degrees retroversion.  Once that was impacted in place, we retracted the humerus posteriorly.  We then did a 360-degree capsular removal and glenoid labrum removal.  We also removed the remnant of biceps.  We had full exposure of the glenoid, which was extremely small.  We went ahead and removed the remaining cartilage off the glenoid face, placed our guide pin centrally for reaming for the metaglene.  We then reamed for the metaglene and the size and the metaglene was exactly the  same size as her glenoid.  It was extremely small.  Once we had the reaming completed, we drilled the central peg hole out.  We impacted the metaglene into position. With the metaglene in the appropriate position referencing off the inferior scapular neck, we drilled our 2 locking screws.  We could not get the anterior-posterior due to how small her glenoid was, but we got 2 good screws of 42 inferiorly and 30 at the base of the coracoid for excellent purchase and fixation.  We then placed a 38 standard glenosphere and impacted that and screwed it home. With that in position, we checked the axillary nerve to make sure it was free and clear, which it was.  We reduced the shoulder with a 38+ 3 trial and we were happy with that.  We removed all trial components on the humeral side.  We irrigated and then dried and then used a hybrid type technique with a HA-coated press-fit stem, so a 10 body, epi-1 right, set on the 0 setting, and then placed in 10 degrees of retroversion.  We did use a little bit of cement in the metaphysis and placed that HA stem in, so we would get a lot of bone up top that was still compressed against the HA, which should heal, but we did want some initial back up for stability so the cement around the metaphysis was appropriate.  Once that cement was allowed to harden, then we trialed again with a 38+ 3, selected 38+ 6 as we felt like we could get just a little bit of extra tightening and that gave [place] nice secure fit, a good tight conjoint, the axillary nerve was not under undue tension and we had full range of motion.  No impingement.  No gapping with inferior pole or external rotation.  The patient was then irrigated thoroughly. We removed the remnant of the subscapularis and closed the deltopectoral interval with 0 Vicryl suture followed by 2-0 Vicryl for subcutaneous closure, and 4-0 Monocryl for skin.  Steri-Strips applied followed by sterile dressing.  The  patient tolerated the surgery well.     Doran Heater. Veverly Fells, M.D.   ______________________________ Doran Heater. Veverly Fells, M.D.    SRN/MEDQ  D:  02/04/2016  T:  02/05/2016  Job:  [id number]

## 2016-02-05 NOTE — Progress Notes (Addendum)
Occupational Therapy Treatment/Discharge Patient Details Name: Courtney Boyer MRN: [id number] DOB: 1933-03-21 Today's Date: 02/05/2016    History of present illness Pt is an 82 y.o. female s/p R reverse total shoulder arthroplasty. Pt has a PMH significant for but not limited to arthritis, high cholesterol, history of anemia, and hypothyroidism. Also has past surgical history including L shoulder arthroscopy with subacromial decompression and mini open rotator cuff repair.   OT comments  Pt progressing well toward OT goals. Pt able to complete UB ADL with min assist and all functional mobility with supervision. Pt able to pack bags with belongings in anticipation of D/C home with supervision. All education complete concerning ADL techniques and pt demonstrates and verbalizes understanding. Pt has no further acute OT needs. Continue to recommend D/C home with 24 hour assistance from family. OT will sign off.   Follow Up Recommendations  No OT follow up;Other (comment) (Progress shoulder rehab per MD)    Equipment Recommendations  None recommended by OT    Recommendations for Other Services      Precautions / Restrictions Precautions Precautions: Shoulder Type of Shoulder Precautions: Active protocol: sling for comfort and sleep, NWB RUE, AROm elbow/wrist/hand, AROM/PROM shoulder (forward flexion: 0-90, external rotation 0-30, abduction: 0-60) Shoulder Interventions: Shoulder sling/immobilizer Precaution Booklet Issued: Yes (comment) Precaution Comments: Reviewed handout Required Braces or Orthoses: Sling (R shoulder) Restrictions Weight Bearing Restrictions: Yes RUE Weight Bearing: Non weight bearing       Mobility Bed Mobility               General bed mobility comments: Received in recliner  Transfers Overall transfer level: Modified independent               General transfer comment: Increased time and using R shoulder sling    Balance Overall balance  assessment: Needs assistance Sitting-balance support: No upper extremity supported;Feet supported Sitting balance-Leahy Scale: Normal     Standing balance support: No upper extremity supported;During functional activity Standing balance-Leahy Scale: Good                     ADL Overall ADL's : Needs assistance/impaired     Grooming: Brushing hair;Supervision/safety;Standing   Upper Body Bathing: Minimal assistance;Sitting   Lower Body Bathing: Supervison/ safety;Sit to/from stand   Upper Body Dressing : Minimal assistance;Sitting   Lower Body Dressing: Supervision/safety;Sit to/from stand   Toilet Transfer: Supervision/safety;Ambulation   Toileting- Clothing Manipulation and Hygiene: Supervision/safety;Sit to/from stand   Tub/ Shower Transfer: Walk-in shower;Supervision/safety;Ambulation   Functional mobility during ADLs: Supervision/safety General ADL Comments: Pt educated on dressing/bathing techniuqes, HEP, sling wear, and sleeping positioning.      Vision                     Perception     Praxis      Cognition   Behavior During Therapy: WFL for tasks assessed/performed Overall Cognitive Status: Within Functional Limits for tasks assessed                       Extremity/Trunk Assessment               Exercises Shoulder Exercises Shoulder Flexion: AAROM;10 reps;Right;Supine (0-90) Shoulder ABduction: AAROM;10 reps;Right;Seated (0-60) Shoulder External Rotation: AAROM;10 reps;Right;Supine (0-30) Elbow Flexion: AROM;Standing;10 reps;Right Wrist Flexion: AROM;10 reps;Right;Seated Digit Composite Flexion: AROM;10 reps;Seated Donning/doffing shirt without moving shoulder: Minimal assistance Method for sponge bathing under operated UE: Minimal assistance Donning/doffing sling/immobilizer: Minimal assistance Correct positioning  of sling/immobilizer: Supervision/safety ROM for elbow, wrist and digits of operated UE:  Supervision/safety Sling wearing schedule (on at all times/off for ADL's): Supervision/safety Proper positioning of operated UE when showering: Supervision/safety Positioning of UE while sleeping: Supervision/safety   Shoulder Instructions Shoulder Instructions Donning/doffing shirt without moving shoulder: Minimal assistance Method for sponge bathing under operated UE: Minimal assistance Donning/doffing sling/immobilizer: Minimal assistance Correct positioning of sling/immobilizer: Supervision/safety ROM for elbow, wrist and digits of operated UE: Supervision/safety Sling wearing schedule (on at all times/off for ADL's): Supervision/safety Proper positioning of operated UE when showering: Supervision/safety Positioning of UE while sleeping: Supervision/safety     General Comments      Pertinent Vitals/ Pain       Pain Assessment: Faces Faces Pain Scale: Hurts a little bit Pain Location: R shoulder Pain Descriptors / Indicators: Sore;Operative site guarding Pain Intervention(s): Limited activity within patient's tolerance;Monitored during session;Repositioned;Ice applied  Home Living                                          Prior Functioning/Environment              Frequency  Min 2X/week        Progress Toward Goals  OT Goals(current goals can now be found in the care plan section)  Progress towards OT goals: Progressing toward goals  Acute Rehab OT Goals Patient Stated Goal: go home OT Goal Formulation: With patient Time For Goal Achievement: 02/12/16 Potential to Achieve Goals: Good ADL Goals Pt Will Perform Upper Body Dressing: with min guard assist;sitting Pt Will Transfer to Toilet: with modified independence;ambulating Pt Will Perform Tub/Shower Transfer: with modified independence;ambulating;shower seat;Shower transfer Pt/caregiver will Perform Home Exercise Program: Right Upper extremity;Increased ROM;With written HEP provided  (Shoulder active protocol per MD)  Plan Discharge plan remains appropriate    Co-evaluation                 End of Session Equipment Utilized During Treatment: Gait belt (R shoulder sling)   Activity Tolerance Patient tolerated treatment well   Patient Left in chair;with call bell/phone within reach   Nurse Communication Mobility status;Other (comment) (OT complete)        Time: [id number] OT Time Calculation (min): 35 min  Charges: OT General Charges $OT Visit: 1 Procedure OT Treatments $Self Care/Home Management : 8-22 mins $Therapeutic Exercise: 8-22 mins  Norman Herrlich, OTR/L [phone] 02/05/2016, 11:14 AM

## 2016-02-05 NOTE — Progress Notes (Signed)
Rn d/c Iv, and wheeled patient out to [address] where patients grand daughter was waiting in car. RN gave patient prescription for pain medicine and muscle relaxant. RN advised patient to call MD office today or tomorrow to schedule follow up appointment. Pt verbalized understanding.

## 2016-02-05 NOTE — Care Management (Signed)
Patient has no Home health needs. Ricki Miller, RN BSN Case manager

## 2016-02-20 DIAGNOSIS — Z96611 Presence of right artificial shoulder joint: Secondary | ICD-10-CM | POA: Diagnosis not present

## 2016-03-19 DIAGNOSIS — Z96611 Presence of right artificial shoulder joint: Secondary | ICD-10-CM | POA: Diagnosis not present

## 2016-04-15 DIAGNOSIS — Z96611 Presence of right artificial shoulder joint: Secondary | ICD-10-CM | POA: Diagnosis not present

## 2016-04-16 DIAGNOSIS — H2513 Age-related nuclear cataract, bilateral: Secondary | ICD-10-CM | POA: Diagnosis not present

## 2016-05-26 DIAGNOSIS — G8918 Other acute postprocedural pain: Secondary | ICD-10-CM | POA: Diagnosis not present

## 2016-05-26 DIAGNOSIS — Y929 Unspecified place or not applicable: Secondary | ICD-10-CM | POA: Diagnosis not present

## 2016-05-26 DIAGNOSIS — Y939 Activity, unspecified: Secondary | ICD-10-CM | POA: Diagnosis not present

## 2016-05-26 DIAGNOSIS — Y999 Unspecified external cause status: Secondary | ICD-10-CM | POA: Diagnosis not present

## 2016-05-26 DIAGNOSIS — M84411A Pathological fracture, right shoulder, initial encounter for fracture: Secondary | ICD-10-CM | POA: Diagnosis not present

## 2016-05-26 DIAGNOSIS — S42123A Displaced fracture of acromial process, unspecified shoulder, initial encounter for closed fracture: Secondary | ICD-10-CM | POA: Diagnosis not present

## 2016-05-26 DIAGNOSIS — S42122A Displaced fracture of acromial process, left shoulder, initial encounter for closed fracture: Secondary | ICD-10-CM | POA: Diagnosis not present

## 2016-05-30 DIAGNOSIS — R8299 Other abnormal findings in urine: Secondary | ICD-10-CM | POA: Diagnosis not present

## 2016-05-30 DIAGNOSIS — M810 Age-related osteoporosis without current pathological fracture: Secondary | ICD-10-CM | POA: Diagnosis not present

## 2016-05-30 DIAGNOSIS — E038 Other specified hypothyroidism: Secondary | ICD-10-CM | POA: Diagnosis not present

## 2016-05-30 DIAGNOSIS — E784 Other hyperlipidemia: Secondary | ICD-10-CM | POA: Diagnosis not present

## 2016-06-06 DIAGNOSIS — E784 Other hyperlipidemia: Secondary | ICD-10-CM | POA: Diagnosis not present

## 2016-06-06 DIAGNOSIS — E038 Other specified hypothyroidism: Secondary | ICD-10-CM | POA: Diagnosis not present

## 2016-06-06 DIAGNOSIS — M810 Age-related osteoporosis without current pathological fracture: Secondary | ICD-10-CM | POA: Diagnosis not present

## 2016-06-06 DIAGNOSIS — Z6824 Body mass index (BMI) 24.0-24.9, adult: Secondary | ICD-10-CM | POA: Diagnosis not present

## 2016-06-06 DIAGNOSIS — H9193 Unspecified hearing loss, bilateral: Secondary | ICD-10-CM | POA: Diagnosis not present

## 2016-06-06 DIAGNOSIS — Z0000 Encounter for general adult medical examination without abnormal findings: Secondary | ICD-10-CM | POA: Diagnosis not present

## 2016-06-06 DIAGNOSIS — Z1389 Encounter for screening for other disorder: Secondary | ICD-10-CM | POA: Diagnosis not present

## 2016-06-10 DIAGNOSIS — S42121D Displaced fracture of acromial process, right shoulder, subsequent encounter for fracture with routine healing: Secondary | ICD-10-CM | POA: Diagnosis not present

## 2016-07-08 DIAGNOSIS — S42121D Displaced fracture of acromial process, right shoulder, subsequent encounter for fracture with routine healing: Secondary | ICD-10-CM | POA: Diagnosis not present

## 2016-08-05 DIAGNOSIS — S42121D Displaced fracture of acromial process, right shoulder, subsequent encounter for fracture with routine healing: Secondary | ICD-10-CM | POA: Diagnosis not present

## 2016-08-06 DIAGNOSIS — M810 Age-related osteoporosis without current pathological fracture: Secondary | ICD-10-CM | POA: Diagnosis not present

## 2016-08-13 DIAGNOSIS — H903 Sensorineural hearing loss, bilateral: Secondary | ICD-10-CM | POA: Diagnosis not present

## 2016-11-06 DIAGNOSIS — Z96611 Presence of right artificial shoulder joint: Secondary | ICD-10-CM | POA: Diagnosis not present

## 2016-11-06 DIAGNOSIS — S42121D Displaced fracture of acromial process, right shoulder, subsequent encounter for fracture with routine healing: Secondary | ICD-10-CM | POA: Diagnosis not present

## 2016-11-06 DIAGNOSIS — Z471 Aftercare following joint replacement surgery: Secondary | ICD-10-CM | POA: Diagnosis not present

## 2016-11-06 DIAGNOSIS — Z4789 Encounter for other orthopedic aftercare: Secondary | ICD-10-CM | POA: Diagnosis not present

## 2017-02-06 DIAGNOSIS — M810 Age-related osteoporosis without current pathological fracture: Secondary | ICD-10-CM | POA: Diagnosis not present

## 2017-02-27 DIAGNOSIS — J029 Acute pharyngitis, unspecified: Secondary | ICD-10-CM | POA: Diagnosis not present

## 2017-04-16 DIAGNOSIS — H2513 Age-related nuclear cataract, bilateral: Secondary | ICD-10-CM | POA: Diagnosis not present

## 2017-04-29 DIAGNOSIS — Z96611 Presence of right artificial shoulder joint: Secondary | ICD-10-CM | POA: Diagnosis not present

## 2017-05-20 IMAGING — CR DG SHOULDER 2+V PORT*R*
1 series · 1 of 1 positions shown · non-contrast
Comparison: No recent prior.

CLINICAL DATA: Shoulder replacement.

EXAM:
PORTABLE RIGHT SHOULDER

[ap]
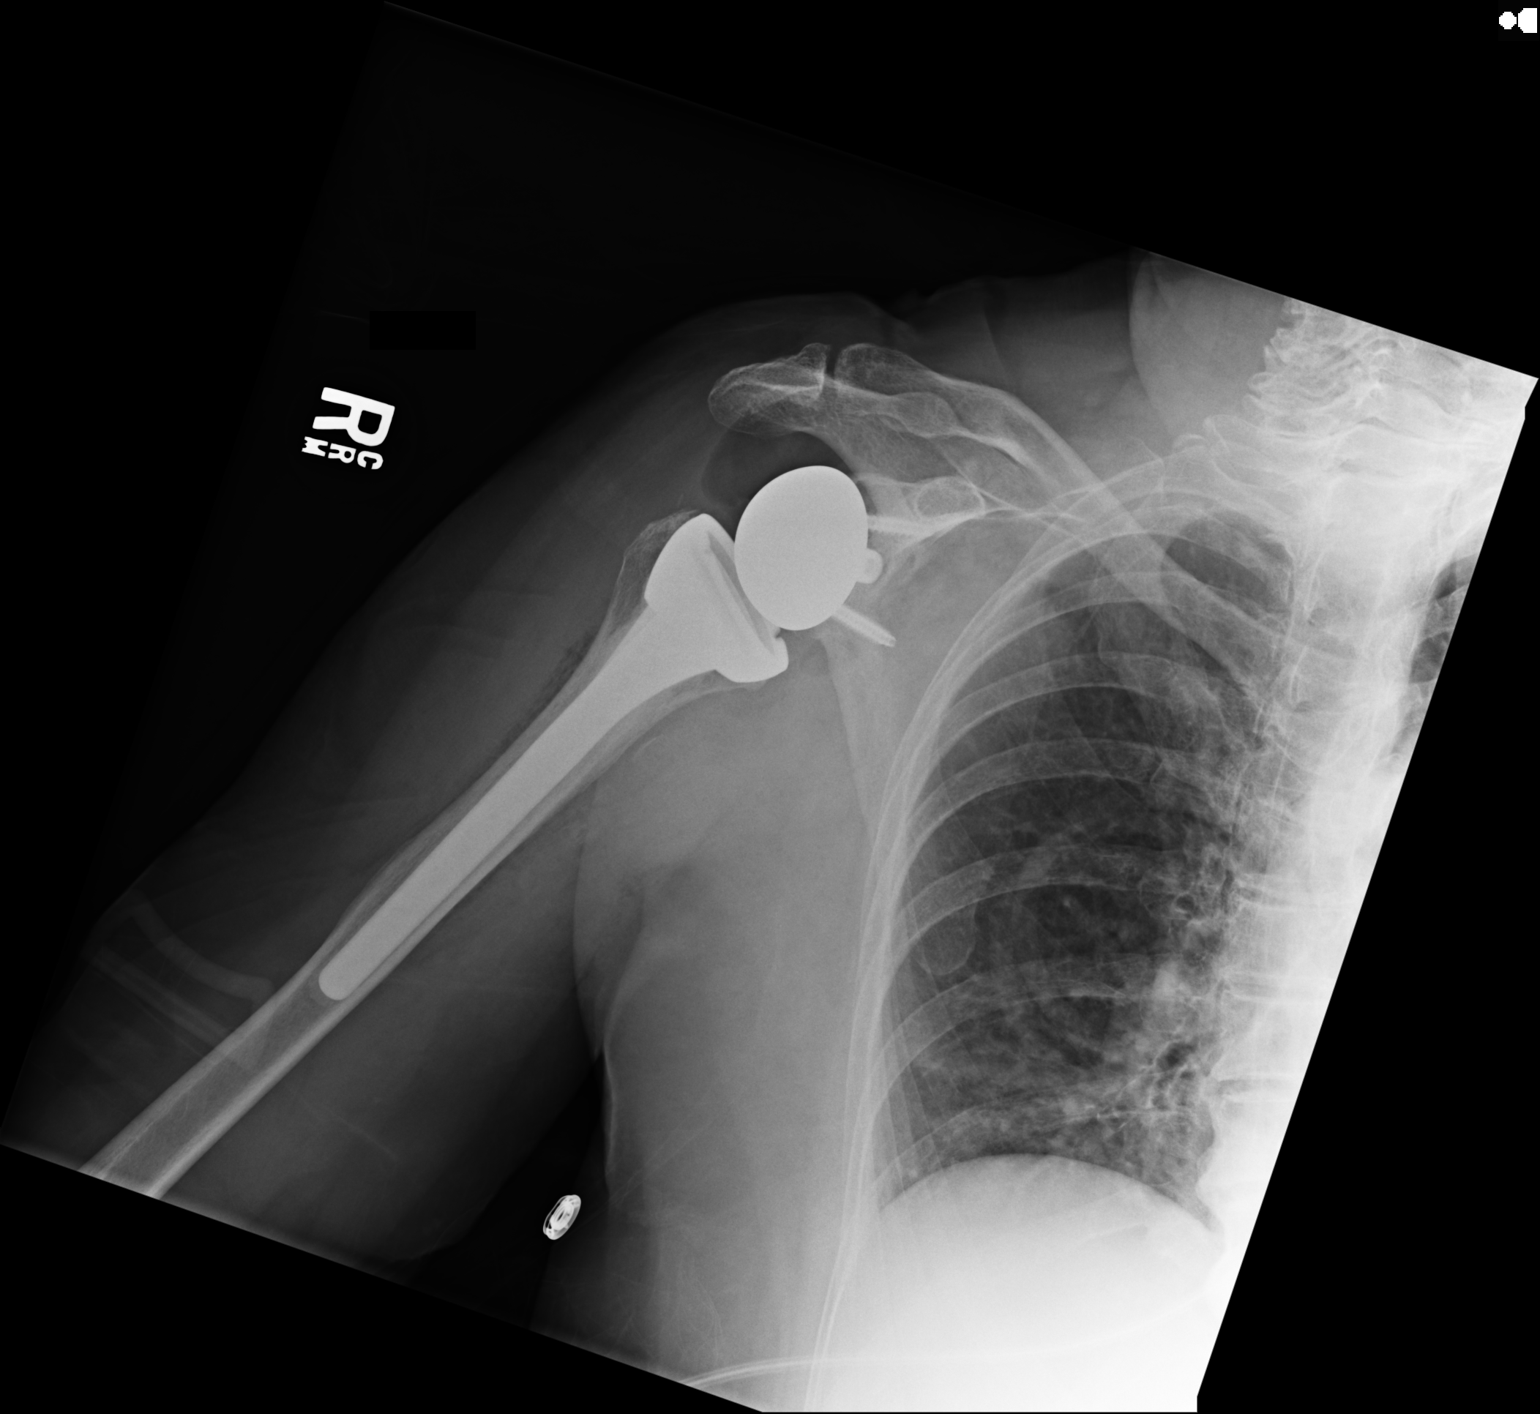

[1 of 1 positions shown; findings below may reference images not displayed]

FINDINGS: Total right shoulder replacement. Hardware intact. Anatomic
alignment on one view. Diffuse degenerative change.
IMPRESSION: The right shoulder replacement with anatomic alignment on one view.

## 2017-06-04 DIAGNOSIS — E7849 Other hyperlipidemia: Secondary | ICD-10-CM | POA: Diagnosis not present

## 2017-06-04 DIAGNOSIS — E038 Other specified hypothyroidism: Secondary | ICD-10-CM | POA: Diagnosis not present

## 2017-06-04 DIAGNOSIS — R82998 Other abnormal findings in urine: Secondary | ICD-10-CM | POA: Diagnosis not present

## 2017-06-04 DIAGNOSIS — M810 Age-related osteoporosis without current pathological fracture: Secondary | ICD-10-CM | POA: Diagnosis not present

## 2017-06-11 DIAGNOSIS — M62830 Muscle spasm of back: Secondary | ICD-10-CM | POA: Diagnosis not present

## 2017-06-11 DIAGNOSIS — H9193 Unspecified hearing loss, bilateral: Secondary | ICD-10-CM | POA: Diagnosis not present

## 2017-06-11 DIAGNOSIS — Z1389 Encounter for screening for other disorder: Secondary | ICD-10-CM | POA: Diagnosis not present

## 2017-06-11 DIAGNOSIS — E7849 Other hyperlipidemia: Secondary | ICD-10-CM | POA: Diagnosis not present

## 2017-06-11 DIAGNOSIS — Z6824 Body mass index (BMI) 24.0-24.9, adult: Secondary | ICD-10-CM | POA: Diagnosis not present

## 2017-06-11 DIAGNOSIS — E038 Other specified hypothyroidism: Secondary | ICD-10-CM | POA: Diagnosis not present

## 2017-06-11 DIAGNOSIS — Z0000 Encounter for general adult medical examination without abnormal findings: Secondary | ICD-10-CM | POA: Diagnosis not present

## 2017-06-11 DIAGNOSIS — M810 Age-related osteoporosis without current pathological fracture: Secondary | ICD-10-CM | POA: Diagnosis not present

## 2017-06-19 ENCOUNTER — Other Ambulatory Visit (HOSPITAL_COMMUNITY): Payer: Self-pay

## 2017-06-19 ENCOUNTER — Other Ambulatory Visit (HOSPITAL_COMMUNITY): Payer: Self-pay | Admitting: *Deleted

## 2017-06-22 ENCOUNTER — Ambulatory Visit (HOSPITAL_COMMUNITY)
Admission: RE | Admit: 2017-06-22 | Discharge: 2017-06-22 | Disposition: A | Discharge status: Home / Self Care | Payer: PPO | Source: Ambulatory Visit | Attending: Internal Medicine | Admitting: Internal Medicine

## 2017-06-22 DIAGNOSIS — M810 Age-related osteoporosis without current pathological fracture: Secondary | ICD-10-CM | POA: Insufficient documentation

## 2017-06-22 MED ORDER — DENOSUMAB 60 MG/ML ~~LOC~~ SOSY
60.0000 mg | PREFILLED_SYRINGE | Freq: Once | SUBCUTANEOUS | Status: AC
  Administered 2017-06-22: 13:00:00 60 mg via SUBCUTANEOUS
  Filled 2017-06-22: qty 1

## 2017-06-23 DIAGNOSIS — D225 Melanocytic nevi of trunk: Secondary | ICD-10-CM | POA: Diagnosis not present

## 2017-06-23 DIAGNOSIS — D1801 Hemangioma of skin and subcutaneous tissue: Secondary | ICD-10-CM | POA: Diagnosis not present

## 2017-06-23 DIAGNOSIS — L821 Other seborrheic keratosis: Secondary | ICD-10-CM | POA: Diagnosis not present

## 2017-06-23 DIAGNOSIS — L309 Dermatitis, unspecified: Secondary | ICD-10-CM | POA: Diagnosis not present

## 2017-06-23 DIAGNOSIS — L814 Other melanin hyperpigmentation: Secondary | ICD-10-CM | POA: Diagnosis not present

## 2017-06-29 DIAGNOSIS — Z1212 Encounter for screening for malignant neoplasm of rectum: Secondary | ICD-10-CM | POA: Diagnosis not present

## 2017-07-22 DIAGNOSIS — J209 Acute bronchitis, unspecified: Secondary | ICD-10-CM | POA: Diagnosis not present

## 2017-07-22 DIAGNOSIS — R05 Cough: Secondary | ICD-10-CM | POA: Diagnosis not present

## 2017-09-28 DIAGNOSIS — C44729 Squamous cell carcinoma of skin of left lower limb, including hip: Secondary | ICD-10-CM | POA: Diagnosis not present

## 2017-09-28 DIAGNOSIS — D485 Neoplasm of uncertain behavior of skin: Secondary | ICD-10-CM | POA: Diagnosis not present

## 2017-10-20 DIAGNOSIS — D0472 Carcinoma in situ of skin of left lower limb, including hip: Secondary | ICD-10-CM | POA: Diagnosis not present

## 2018-02-02 ENCOUNTER — Encounter (HOSPITAL_COMMUNITY): Payer: PPO

## 2018-02-04 ENCOUNTER — Other Ambulatory Visit (HOSPITAL_COMMUNITY): Payer: Self-pay

## 2018-02-05 ENCOUNTER — Ambulatory Visit (HOSPITAL_COMMUNITY)
Admission: RE | Admit: 2018-02-05 | Discharge: 2018-02-05 | Disposition: A | Discharge status: Home / Self Care | Payer: PPO | Source: Ambulatory Visit | Attending: Internal Medicine | Admitting: Internal Medicine

## 2018-02-05 DIAGNOSIS — M810 Age-related osteoporosis without current pathological fracture: Secondary | ICD-10-CM | POA: Diagnosis not present

## 2018-02-05 MED ORDER — DENOSUMAB 60 MG/ML ~~LOC~~ SOSY
PREFILLED_SYRINGE | SUBCUTANEOUS | Status: AC
  Filled 2018-02-05: qty 1

## 2018-02-05 MED ORDER — DENOSUMAB 60 MG/ML ~~LOC~~ SOSY
60.0000 mg | PREFILLED_SYRINGE | Freq: Once | SUBCUTANEOUS | Status: AC
  Administered 2018-02-05: 60 mg via SUBCUTANEOUS

## 2018-04-20 DIAGNOSIS — D1801 Hemangioma of skin and subcutaneous tissue: Secondary | ICD-10-CM | POA: Diagnosis not present

## 2018-04-20 DIAGNOSIS — L820 Inflamed seborrheic keratosis: Secondary | ICD-10-CM | POA: Diagnosis not present

## 2018-04-20 DIAGNOSIS — Z85828 Personal history of other malignant neoplasm of skin: Secondary | ICD-10-CM | POA: Diagnosis not present

## 2018-04-20 DIAGNOSIS — L814 Other melanin hyperpigmentation: Secondary | ICD-10-CM | POA: Diagnosis not present

## 2018-04-28 DIAGNOSIS — M25512 Pain in left shoulder: Secondary | ICD-10-CM | POA: Diagnosis not present

## 2018-04-28 DIAGNOSIS — M25511 Pain in right shoulder: Secondary | ICD-10-CM | POA: Diagnosis not present

## 2018-04-28 DIAGNOSIS — Z9889 Other specified postprocedural states: Secondary | ICD-10-CM | POA: Diagnosis not present

## 2018-06-08 DIAGNOSIS — E038 Other specified hypothyroidism: Secondary | ICD-10-CM | POA: Diagnosis not present

## 2018-06-08 DIAGNOSIS — M810 Age-related osteoporosis without current pathological fracture: Secondary | ICD-10-CM | POA: Diagnosis not present

## 2018-06-08 DIAGNOSIS — E7849 Other hyperlipidemia: Secondary | ICD-10-CM | POA: Diagnosis not present

## 2018-06-15 DIAGNOSIS — R82998 Other abnormal findings in urine: Secondary | ICD-10-CM | POA: Diagnosis not present

## 2018-06-15 DIAGNOSIS — M810 Age-related osteoporosis without current pathological fracture: Secondary | ICD-10-CM | POA: Diagnosis not present

## 2018-06-15 DIAGNOSIS — Z0000 Encounter for general adult medical examination without abnormal findings: Secondary | ICD-10-CM | POA: Diagnosis not present

## 2018-07-20 DIAGNOSIS — D485 Neoplasm of uncertain behavior of skin: Secondary | ICD-10-CM | POA: Diagnosis not present

## 2018-07-20 DIAGNOSIS — C44729 Squamous cell carcinoma of skin of left lower limb, including hip: Secondary | ICD-10-CM | POA: Diagnosis not present

## 2018-07-28 DIAGNOSIS — C44719 Basal cell carcinoma of skin of left lower limb, including hip: Secondary | ICD-10-CM | POA: Diagnosis not present

## 2018-07-28 DIAGNOSIS — L820 Inflamed seborrheic keratosis: Secondary | ICD-10-CM | POA: Diagnosis not present

## 2018-07-28 DIAGNOSIS — L309 Dermatitis, unspecified: Secondary | ICD-10-CM | POA: Diagnosis not present

## 2018-08-09 ENCOUNTER — Encounter (HOSPITAL_COMMUNITY): Payer: PPO

## 2018-08-17 ENCOUNTER — Encounter (HOSPITAL_COMMUNITY): Payer: PPO

## 2018-09-07 ENCOUNTER — Other Ambulatory Visit: Payer: Self-pay

## 2018-09-07 ENCOUNTER — Other Ambulatory Visit (HOSPITAL_COMMUNITY): Payer: Self-pay

## 2018-09-08 ENCOUNTER — Ambulatory Visit (HOSPITAL_COMMUNITY)
Admission: RE | Admit: 2018-09-08 | Discharge: 2018-09-08 | Disposition: A | Discharge status: Home / Self Care | Payer: PPO | Source: Ambulatory Visit | Attending: Internal Medicine | Admitting: Internal Medicine

## 2018-09-08 DIAGNOSIS — M810 Age-related osteoporosis without current pathological fracture: Secondary | ICD-10-CM | POA: Insufficient documentation

## 2018-09-08 MED ORDER — DENOSUMAB 60 MG/ML ~~LOC~~ SOSY
60.0000 mg | PREFILLED_SYRINGE | Freq: Once | SUBCUTANEOUS | Status: AC
  Administered 2018-09-08: 60 mg via SUBCUTANEOUS

## 2018-09-08 MED ORDER — DENOSUMAB 60 MG/ML ~~LOC~~ SOSY
PREFILLED_SYRINGE | SUBCUTANEOUS | Status: AC
  Filled 2018-09-08: qty 1

## 2018-09-29 DIAGNOSIS — M545 Low back pain: Secondary | ICD-10-CM | POA: Diagnosis not present

## 2018-09-29 DIAGNOSIS — M5136 Other intervertebral disc degeneration, lumbar region: Secondary | ICD-10-CM | POA: Diagnosis not present

## 2018-09-29 DIAGNOSIS — S76312A Strain of muscle, fascia and tendon of the posterior muscle group at thigh level, left thigh, initial encounter: Secondary | ICD-10-CM | POA: Diagnosis not present

## 2018-09-29 DIAGNOSIS — M25552 Pain in left hip: Secondary | ICD-10-CM | POA: Diagnosis not present

## 2018-09-29 DIAGNOSIS — M5430 Sciatica, unspecified side: Secondary | ICD-10-CM | POA: Diagnosis not present

## 2018-10-08 DIAGNOSIS — M25562 Pain in left knee: Secondary | ICD-10-CM | POA: Diagnosis not present

## 2018-10-08 DIAGNOSIS — M1712 Unilateral primary osteoarthritis, left knee: Secondary | ICD-10-CM | POA: Diagnosis not present

## 2018-10-28 DIAGNOSIS — L308 Other specified dermatitis: Secondary | ICD-10-CM | POA: Diagnosis not present

## 2018-10-28 DIAGNOSIS — D225 Melanocytic nevi of trunk: Secondary | ICD-10-CM | POA: Diagnosis not present

## 2018-10-28 DIAGNOSIS — Z85828 Personal history of other malignant neoplasm of skin: Secondary | ICD-10-CM | POA: Diagnosis not present

## 2018-10-28 DIAGNOSIS — L821 Other seborrheic keratosis: Secondary | ICD-10-CM | POA: Diagnosis not present

## 2018-10-28 DIAGNOSIS — D1801 Hemangioma of skin and subcutaneous tissue: Secondary | ICD-10-CM | POA: Diagnosis not present

## 2018-12-22 DIAGNOSIS — M48062 Spinal stenosis, lumbar region with neurogenic claudication: Secondary | ICD-10-CM | POA: Diagnosis not present

## 2018-12-22 DIAGNOSIS — M25552 Pain in left hip: Secondary | ICD-10-CM | POA: Diagnosis not present

## 2018-12-22 DIAGNOSIS — M545 Low back pain: Secondary | ICD-10-CM | POA: Diagnosis not present

## 2018-12-30 DIAGNOSIS — M545 Low back pain: Secondary | ICD-10-CM | POA: Diagnosis not present

## 2019-01-26 DIAGNOSIS — M545 Low back pain: Secondary | ICD-10-CM | POA: Diagnosis not present

## 2019-01-26 DIAGNOSIS — M5136 Other intervertebral disc degeneration, lumbar region: Secondary | ICD-10-CM | POA: Diagnosis not present

## 2019-01-26 DIAGNOSIS — M48062 Spinal stenosis, lumbar region with neurogenic claudication: Secondary | ICD-10-CM | POA: Diagnosis not present

## 2019-03-01 DIAGNOSIS — M5136 Other intervertebral disc degeneration, lumbar region: Secondary | ICD-10-CM | POA: Diagnosis not present

## 2019-04-20 DIAGNOSIS — H353130 Nonexudative age-related macular degeneration, bilateral, stage unspecified: Secondary | ICD-10-CM | POA: Diagnosis not present

## 2019-04-26 ENCOUNTER — Encounter (HOSPITAL_COMMUNITY): Payer: PPO

## 2019-04-27 ENCOUNTER — Other Ambulatory Visit (HOSPITAL_COMMUNITY): Payer: Self-pay | Admitting: *Deleted

## 2019-04-28 ENCOUNTER — Ambulatory Visit (HOSPITAL_COMMUNITY)
Admission: RE | Admit: 2019-04-28 | Discharge: 2019-04-28 | Disposition: A | Discharge status: Home / Self Care | Payer: PPO | Source: Ambulatory Visit | Attending: Internal Medicine | Admitting: Internal Medicine

## 2019-04-28 ENCOUNTER — Other Ambulatory Visit: Payer: Self-pay

## 2019-04-28 DIAGNOSIS — M810 Age-related osteoporosis without current pathological fracture: Secondary | ICD-10-CM | POA: Diagnosis not present

## 2019-04-28 MED ORDER — DENOSUMAB 60 MG/ML ~~LOC~~ SOSY
PREFILLED_SYRINGE | SUBCUTANEOUS | Status: AC
  Filled 2019-04-28: qty 1

## 2019-04-28 MED ORDER — DENOSUMAB 60 MG/ML ~~LOC~~ SOSY
60.0000 mg | PREFILLED_SYRINGE | Freq: Once | SUBCUTANEOUS | Status: AC
  Administered 2019-04-28: 60 mg via SUBCUTANEOUS

## 2019-05-03 DIAGNOSIS — H40013 Open angle with borderline findings, low risk, bilateral: Secondary | ICD-10-CM | POA: Diagnosis not present

## 2019-05-03 DIAGNOSIS — H2513 Age-related nuclear cataract, bilateral: Secondary | ICD-10-CM | POA: Diagnosis not present

## 2019-06-14 DIAGNOSIS — E7849 Other hyperlipidemia: Secondary | ICD-10-CM | POA: Diagnosis not present

## 2019-06-14 DIAGNOSIS — M810 Age-related osteoporosis without current pathological fracture: Secondary | ICD-10-CM | POA: Diagnosis not present

## 2019-06-14 DIAGNOSIS — E038 Other specified hypothyroidism: Secondary | ICD-10-CM | POA: Diagnosis not present

## 2019-06-20 DIAGNOSIS — E785 Hyperlipidemia, unspecified: Secondary | ICD-10-CM | POA: Diagnosis not present

## 2019-06-20 DIAGNOSIS — H9190 Unspecified hearing loss, unspecified ear: Secondary | ICD-10-CM | POA: Diagnosis not present

## 2019-06-20 DIAGNOSIS — E039 Hypothyroidism, unspecified: Secondary | ICD-10-CM | POA: Diagnosis not present

## 2019-06-20 DIAGNOSIS — M810 Age-related osteoporosis without current pathological fracture: Secondary | ICD-10-CM | POA: Diagnosis not present

## 2019-06-20 DIAGNOSIS — Z1331 Encounter for screening for depression: Secondary | ICD-10-CM | POA: Diagnosis not present

## 2019-06-20 DIAGNOSIS — R82998 Other abnormal findings in urine: Secondary | ICD-10-CM | POA: Diagnosis not present

## 2019-06-20 DIAGNOSIS — Z0000 Encounter for general adult medical examination without abnormal findings: Secondary | ICD-10-CM | POA: Diagnosis not present

## 2019-06-20 DIAGNOSIS — R6889 Other general symptoms and signs: Secondary | ICD-10-CM | POA: Diagnosis not present

## 2019-06-22 DIAGNOSIS — Z23 Encounter for immunization: Secondary | ICD-10-CM | POA: Diagnosis not present

## 2019-09-06 DIAGNOSIS — Z85828 Personal history of other malignant neoplasm of skin: Secondary | ICD-10-CM | POA: Diagnosis not present

## 2019-09-06 DIAGNOSIS — L905 Scar conditions and fibrosis of skin: Secondary | ICD-10-CM | POA: Diagnosis not present

## 2019-09-06 DIAGNOSIS — L819 Disorder of pigmentation, unspecified: Secondary | ICD-10-CM | POA: Diagnosis not present

## 2019-09-06 DIAGNOSIS — L821 Other seborrheic keratosis: Secondary | ICD-10-CM | POA: Diagnosis not present

## 2019-09-06 DIAGNOSIS — D229 Melanocytic nevi, unspecified: Secondary | ICD-10-CM | POA: Diagnosis not present

## 2019-09-06 DIAGNOSIS — L814 Other melanin hyperpigmentation: Secondary | ICD-10-CM | POA: Diagnosis not present

## 2019-09-06 DIAGNOSIS — D1801 Hemangioma of skin and subcutaneous tissue: Secondary | ICD-10-CM | POA: Diagnosis not present

## 2019-09-27 ENCOUNTER — Other Ambulatory Visit (HOSPITAL_COMMUNITY): Payer: Self-pay | Admitting: *Deleted

## 2019-09-28 ENCOUNTER — Other Ambulatory Visit: Payer: Self-pay

## 2019-09-28 ENCOUNTER — Ambulatory Visit (HOSPITAL_COMMUNITY)
Admission: RE | Admit: 2019-09-28 | Discharge: 2019-09-28 | Disposition: A | Discharge status: Home / Self Care | Payer: PPO | Source: Ambulatory Visit | Attending: Internal Medicine | Admitting: Internal Medicine

## 2019-09-28 DIAGNOSIS — M810 Age-related osteoporosis without current pathological fracture: Secondary | ICD-10-CM | POA: Insufficient documentation

## 2019-09-28 MED ORDER — DENOSUMAB 60 MG/ML ~~LOC~~ SOSY
PREFILLED_SYRINGE | SUBCUTANEOUS | Status: AC
  Administered 2019-09-28: 60 mg
  Filled 2019-09-28: qty 1

## 2019-09-28 MED ORDER — DENOSUMAB 60 MG/ML ~~LOC~~ SOSY: 60.0000 mg | PREFILLED_SYRINGE | Freq: Once | SUBCUTANEOUS | Status: DC

## 2020-02-16 DIAGNOSIS — Z03818 Encounter for observation for suspected exposure to other biological agents ruled out: Secondary | ICD-10-CM | POA: Diagnosis not present

## 2020-04-19 DIAGNOSIS — H353131 Nonexudative age-related macular degeneration, bilateral, early dry stage: Secondary | ICD-10-CM | POA: Diagnosis not present

## 2020-04-30 DIAGNOSIS — Z978 Presence of other specified devices: Secondary | ICD-10-CM | POA: Diagnosis not present

## 2020-04-30 DIAGNOSIS — R2242 Localized swelling, mass and lump, left lower limb: Secondary | ICD-10-CM | POA: Diagnosis not present

## 2020-04-30 DIAGNOSIS — M79672 Pain in left foot: Secondary | ICD-10-CM | POA: Diagnosis not present

## 2020-05-01 DIAGNOSIS — H2513 Age-related nuclear cataract, bilateral: Secondary | ICD-10-CM | POA: Diagnosis not present

## 2020-05-01 DIAGNOSIS — H43811 Vitreous degeneration, right eye: Secondary | ICD-10-CM | POA: Diagnosis not present

## 2020-05-01 DIAGNOSIS — H40013 Open angle with borderline findings, low risk, bilateral: Secondary | ICD-10-CM | POA: Diagnosis not present

## 2020-05-21 DIAGNOSIS — M79672 Pain in left foot: Secondary | ICD-10-CM | POA: Diagnosis not present

## 2020-05-22 DIAGNOSIS — M79672 Pain in left foot: Secondary | ICD-10-CM | POA: Diagnosis not present

## 2020-05-22 DIAGNOSIS — Z978 Presence of other specified devices: Secondary | ICD-10-CM | POA: Diagnosis not present

## 2020-05-28 DIAGNOSIS — M79672 Pain in left foot: Secondary | ICD-10-CM | POA: Diagnosis not present

## 2020-05-28 DIAGNOSIS — T8484XA Pain due to internal orthopedic prosthetic devices, implants and grafts, initial encounter: Secondary | ICD-10-CM | POA: Diagnosis not present

## 2020-05-29 ENCOUNTER — Other Ambulatory Visit (HOSPITAL_COMMUNITY): Payer: Self-pay | Admitting: Orthopedic Surgery

## 2020-05-29 ENCOUNTER — Encounter (HOSPITAL_BASED_OUTPATIENT_CLINIC_OR_DEPARTMENT_OTHER): Payer: Self-pay | Admitting: Orthopedic Surgery

## 2020-05-29 ENCOUNTER — Other Ambulatory Visit: Payer: Self-pay

## 2020-05-29 ENCOUNTER — Other Ambulatory Visit (HOSPITAL_COMMUNITY)
Admission: RE | Admit: 2020-05-29 | Discharge: 2020-05-29 | Disposition: A | Discharge status: Home / Self Care | Payer: PPO | Source: Ambulatory Visit | Attending: Orthopedic Surgery | Admitting: Orthopedic Surgery

## 2020-05-29 DIAGNOSIS — Z01812 Encounter for preprocedural laboratory examination: Secondary | ICD-10-CM | POA: Insufficient documentation

## 2020-05-29 DIAGNOSIS — Z20822 Contact with and (suspected) exposure to covid-19: Secondary | ICD-10-CM | POA: Insufficient documentation

## 2020-05-29 LAB — SARS CORONAVIRUS 2 (TAT 6-24 HRS): SARS Coronavirus 2: NEGATIVE

## 2020-05-31 ENCOUNTER — Ambulatory Visit (HOSPITAL_BASED_OUTPATIENT_CLINIC_OR_DEPARTMENT_OTHER)
Admission: RE | Admit: 2020-05-31 | Discharge: 2020-05-31 | Disposition: A | Discharge status: Home / Self Care | Payer: PPO | Source: Ambulatory Visit | Attending: Orthopedic Surgery | Admitting: Orthopedic Surgery

## 2020-05-31 ENCOUNTER — Encounter (HOSPITAL_BASED_OUTPATIENT_CLINIC_OR_DEPARTMENT_OTHER): Payer: Self-pay | Admitting: Orthopedic Surgery

## 2020-05-31 ENCOUNTER — Ambulatory Visit (HOSPITAL_BASED_OUTPATIENT_CLINIC_OR_DEPARTMENT_OTHER): Payer: PPO | Admitting: Certified Registered"

## 2020-05-31 ENCOUNTER — Encounter (HOSPITAL_BASED_OUTPATIENT_CLINIC_OR_DEPARTMENT_OTHER)
Admission: RE | Disposition: A | Discharge status: Home / Self Care | Payer: Self-pay | Source: Ambulatory Visit | Attending: Orthopedic Surgery

## 2020-05-31 ENCOUNTER — Other Ambulatory Visit: Payer: Self-pay

## 2020-05-31 DIAGNOSIS — Z79890 Hormone replacement therapy (postmenopausal): Secondary | ICD-10-CM | POA: Diagnosis not present

## 2020-05-31 DIAGNOSIS — E039 Hypothyroidism, unspecified: Secondary | ICD-10-CM | POA: Insufficient documentation

## 2020-05-31 DIAGNOSIS — Y831 Surgical operation with implant of artificial internal device as the cause of abnormal reaction of the patient, or of later complication, without mention of misadventure at the time of the procedure: Secondary | ICD-10-CM | POA: Insufficient documentation

## 2020-05-31 DIAGNOSIS — E7800 Pure hypercholesterolemia, unspecified: Secondary | ICD-10-CM | POA: Diagnosis not present

## 2020-05-31 DIAGNOSIS — T85848A Pain due to other internal prosthetic devices, implants and grafts, initial encounter: Secondary | ICD-10-CM

## 2020-05-31 DIAGNOSIS — T8484XA Pain due to internal orthopedic prosthetic devices, implants and grafts, initial encounter: Secondary | ICD-10-CM | POA: Diagnosis not present

## 2020-05-31 DIAGNOSIS — L859 Epidermal thickening, unspecified: Secondary | ICD-10-CM | POA: Diagnosis not present

## 2020-05-31 HISTORY — DX: Pain due to internal orthopedic prosthetic devices, implants and grafts, initial encounter: T84.84XA

## 2020-05-31 HISTORY — PX: HARDWARE REMOVAL: SHX979

## 2020-05-31 SURGERY — REMOVAL, HARDWARE
Anesthesia: Monitor Anesthesia Care | Site: Foot | Laterality: Left

## 2020-05-31 MED ORDER — CEFAZOLIN SODIUM-DEXTROSE 2-4 GM/100ML-% IV SOLN
INTRAVENOUS | Status: AC
  Filled 2020-05-31: qty 100

## 2020-05-31 MED ORDER — VANCOMYCIN HCL 500 MG IV SOLR
INTRAVENOUS | Status: DC | PRN
  Administered 2020-05-31: 500 mg via TOPICAL

## 2020-05-31 MED ORDER — VANCOMYCIN HCL 500 MG IV SOLR
INTRAVENOUS | Status: AC
  Filled 2020-05-31: qty 500

## 2020-05-31 MED ORDER — 0.9 % SODIUM CHLORIDE (POUR BTL) OPTIME
TOPICAL | Status: DC | PRN
  Administered 2020-05-31: 120 mL

## 2020-05-31 MED ORDER — LIDOCAINE 2% (20 MG/ML) 5 ML SYRINGE
INTRAMUSCULAR | Status: DC | PRN
  Administered 2020-05-31: 40 mg via INTRAVENOUS

## 2020-05-31 MED ORDER — PROPOFOL 500 MG/50ML IV EMUL
INTRAVENOUS | Status: DC | PRN
  Administered 2020-05-31: 50 ug/kg/min via INTRAVENOUS

## 2020-05-31 MED ORDER — ONDANSETRON HCL 4 MG/2ML IJ SOLN
INTRAMUSCULAR | Status: DC | PRN
  Administered 2020-05-31: 4 mg via INTRAVENOUS

## 2020-05-31 MED ORDER — PROPOFOL 10 MG/ML IV BOLUS
INTRAVENOUS | Status: DC | PRN
  Administered 2020-05-31: 20 mg via INTRAVENOUS

## 2020-05-31 MED ORDER — LIDOCAINE-EPINEPHRINE 1 %-1:100000 IJ SOLN
INTRAMUSCULAR | Status: AC
  Filled 2020-05-31: qty 1

## 2020-05-31 MED ORDER — LIDOCAINE-EPINEPHRINE 1 %-1:100000 IJ SOLN
INTRAMUSCULAR | Status: DC | PRN
  Administered 2020-05-31: 6 mL

## 2020-05-31 MED ORDER — LACTATED RINGERS IV SOLN: INTRAVENOUS | Status: DC

## 2020-05-31 MED ORDER — SODIUM CHLORIDE 0.9 % IV SOLN: INTRAVENOUS | Status: DC

## 2020-05-31 MED ORDER — BUPIVACAINE HCL (PF) 0.5 % IJ SOLN
INTRAMUSCULAR | Status: AC
  Filled 2020-05-31: qty 30

## 2020-05-31 MED ORDER — BUPIVACAINE HCL (PF) 0.5 % IJ SOLN
INTRAMUSCULAR | Status: DC | PRN
  Administered 2020-05-31: 6 mL

## 2020-05-31 MED ORDER — CEFAZOLIN SODIUM-DEXTROSE 2-4 GM/100ML-% IV SOLN
2.0000 g | INTRAVENOUS | Status: AC
  Administered 2020-05-31: 2 g via INTRAVENOUS

## 2020-05-31 SURGICAL SUPPLY — 68 items
APL PRP STRL LF DISP 70% ISPRP (MISCELLANEOUS) ×1
APL SKNCLS STERI-STRIP NONHPOA (GAUZE/BANDAGES/DRESSINGS)
BANDAGE ESMARK 6X9 LF (GAUZE/BANDAGES/DRESSINGS) IMPLANT
BENZOIN TINCTURE PRP APPL 2/3 (GAUZE/BANDAGES/DRESSINGS) IMPLANT
BLADE SURG 15 STRL LF DISP TIS (BLADE) ×2 IMPLANT
BLADE SURG 15 STRL SS (BLADE) ×4
BNDG CMPR 9X4 STRL LF SNTH (GAUZE/BANDAGES/DRESSINGS) ×1
BNDG CMPR 9X6 STRL LF SNTH (GAUZE/BANDAGES/DRESSINGS)
BNDG COHESIVE 4X5 TAN STRL (GAUZE/BANDAGES/DRESSINGS) IMPLANT
BNDG COHESIVE 6X5 TAN STRL LF (GAUZE/BANDAGES/DRESSINGS) IMPLANT
BNDG ELASTIC 4X5.8 VLCR STR LF (GAUZE/BANDAGES/DRESSINGS) ×1 IMPLANT
BNDG ESMARK 4X9 LF (GAUZE/BANDAGES/DRESSINGS) ×1 IMPLANT
BNDG ESMARK 6X9 LF (GAUZE/BANDAGES/DRESSINGS)
CHLORAPREP W/TINT 26 (MISCELLANEOUS) ×2 IMPLANT
COVER BACK TABLE 60X90IN (DRAPES) ×2 IMPLANT
COVER WAND RF STERILE (DRAPES) IMPLANT
CUFF TOURN SGL QUICK 24 (TOURNIQUET CUFF)
CUFF TOURN SGL QUICK 34 (TOURNIQUET CUFF)
CUFF TRNQT CYL 24X4X16.5-23 (TOURNIQUET CUFF) ×1 IMPLANT
CUFF TRNQT CYL 34X4.125X (TOURNIQUET CUFF) IMPLANT
DECANTER SPIKE VIAL GLASS SM (MISCELLANEOUS) IMPLANT
DRAPE EXTREMITY T 121X128X90 (DISPOSABLE) ×2 IMPLANT
DRAPE OEC MINIVIEW 54X84 (DRAPES) IMPLANT
DRAPE SURG 17X23 STRL (DRAPES) IMPLANT
DRAPE U-SHAPE 47X51 STRL (DRAPES) ×2 IMPLANT
DRSG MEPITEL 4X7.2 (GAUZE/BANDAGES/DRESSINGS) ×2 IMPLANT
DRSG PAD ABDOMINAL 8X10 ST (GAUZE/BANDAGES/DRESSINGS) IMPLANT
ELECT REM PT RETURN 9FT ADLT (ELECTROSURGICAL) ×2
ELECTRODE REM PT RTRN 9FT ADLT (ELECTROSURGICAL) ×1 IMPLANT
GAUZE SPONGE 4X4 12PLY STRL (GAUZE/BANDAGES/DRESSINGS) ×2 IMPLANT
GLOVE SRG 8 PF TXTR STRL LF DI (GLOVE) ×2 IMPLANT
GLOVE SURG ENC MOIS LTX SZ8 (GLOVE) ×2 IMPLANT
GLOVE SURG LTX SZ8 (GLOVE) ×2 IMPLANT
GLOVE SURG UNDER POLY LF SZ8 (GLOVE) ×4
GOWN STRL REUS W/ TWL LRG LVL3 (GOWN DISPOSABLE) ×1 IMPLANT
GOWN STRL REUS W/ TWL XL LVL3 (GOWN DISPOSABLE) ×2 IMPLANT
GOWN STRL REUS W/TWL LRG LVL3 (GOWN DISPOSABLE) ×2
GOWN STRL REUS W/TWL XL LVL3 (GOWN DISPOSABLE) ×4
NDL HYPO 25X1 1.5 SAFETY (NEEDLE) IMPLANT
NEEDLE HYPO 22GX1.5 SAFETY (NEEDLE) ×1 IMPLANT
NEEDLE HYPO 25X1 1.5 SAFETY (NEEDLE) ×2 IMPLANT
PACK BASIN DAY SURGERY FS (CUSTOM PROCEDURE TRAY) ×2 IMPLANT
PAD CAST 4YDX4 CTTN HI CHSV (CAST SUPPLIES) ×1 IMPLANT
PADDING CAST COTTON 4X4 STRL (CAST SUPPLIES) ×2
PADDING CAST COTTON 6X4 STRL (CAST SUPPLIES) IMPLANT
PENCIL SMOKE EVACUATOR (MISCELLANEOUS) ×2 IMPLANT
SANITIZER HAND PURELL 535ML FO (MISCELLANEOUS) ×2 IMPLANT
SHEET MEDIUM DRAPE 40X70 STRL (DRAPES) ×2 IMPLANT
SLEEVE SCD COMPRESS KNEE MED (STOCKING) ×2 IMPLANT
SPLINT FAST PLASTER 5X30 (CAST SUPPLIES)
SPLINT PLASTER CAST FAST 5X30 (CAST SUPPLIES) IMPLANT
SPONGE LAP 18X18 RF (DISPOSABLE) ×2 IMPLANT
STOCKINETTE 6  STRL (DRAPES) ×2
STOCKINETTE 6 STRL (DRAPES) ×1 IMPLANT
STRIP CLOSURE SKIN 1/2X4 (GAUZE/BANDAGES/DRESSINGS) IMPLANT
SUCTION FRAZIER HANDLE 10FR (MISCELLANEOUS)
SUCTION TUBE FRAZIER 10FR DISP (MISCELLANEOUS) IMPLANT
SUT ETHILON 3 0 PS 1 (SUTURE) ×1 IMPLANT
SUT MNCRL AB 3-0 PS2 18 (SUTURE) ×1 IMPLANT
SUT VIC AB 0 SH 27 (SUTURE) IMPLANT
SUT VIC AB 2-0 SH 18 (SUTURE) IMPLANT
SUT VIC AB 2-0 SH 27 (SUTURE)
SUT VIC AB 2-0 SH 27XBRD (SUTURE) IMPLANT
SYR BULB EAR ULCER 3OZ GRN STR (SYRINGE) ×2 IMPLANT
SYR CONTROL 10ML LL (SYRINGE) ×2 IMPLANT
TOWEL GREEN STERILE FF (TOWEL DISPOSABLE) ×2 IMPLANT
TUBE CONNECTING 20X1/4 (TUBING) IMPLANT
UNDERPAD 30X36 HEAVY ABSORB (UNDERPADS AND DIAPERS) ×2 IMPLANT

## 2020-05-31 NOTE — Transfer of Care (Signed)
Immediate Anesthesia Transfer of Care Note  Patient: Courtney Boyer  Procedure(s) Performed: Removal deep implants left First metatarsal and medial cuneiform (Left Foot)  Patient Location: PACU  Anesthesia Type:MAC  Level of Consciousness: awake, alert , oriented and patient cooperative  Airway & Oxygen Therapy: Patient Spontanous Breathing and Patient connected to face mask oxygen  Post-op Assessment: Report given to RN and Post -op Vital signs reviewed and stable  Post vital signs: Reviewed and stable  Last Vitals:  Vitals Value Taken Time  BP    Temp    Pulse 52 05/31/20 1130  Resp    SpO2 100 % 05/31/20 1130  Vitals shown include unvalidated device data.  Last Pain:  Vitals:   05/31/20 0919  TempSrc: Oral  PainSc: 0-No pain         Complications: No complications documented.

## 2020-05-31 NOTE — Anesthesia Preprocedure Evaluation (Addendum)
Anesthesia Evaluation  Patient identified by MRN, date of birth, ID band Patient awake    Reviewed: Allergy & Precautions, NPO status , Patient's Chart, lab work & pertinent test results  Airway Mallampati: III  TM Distance: >3 FB Neck ROM: Full  Mouth opening: Limited Mouth Opening  Dental  (+) Dental Advisory Given Upper veneers:   Pulmonary neg pulmonary ROS,    Pulmonary exam normal breath sounds clear to auscultation       Cardiovascular Normal cardiovascular exam Rhythm:Regular Rate:Normal  HLD   Neuro/Psych negative neurological ROS  negative psych ROS   GI/Hepatic negative GI ROS, Neg liver ROS,   Endo/Other  Hypothyroidism   Renal/GU negative Renal ROS  negative genitourinary   Musculoskeletal  (+) Arthritis ,   Abdominal   Peds  Hematology negative hematology ROS (+)   Anesthesia Other Findings   Reproductive/Obstetrics                            Anesthesia Physical Anesthesia Plan  ASA: II  Anesthesia Plan: MAC   Post-op Pain Management:    Induction: Intravenous  PONV Risk Score and Plan: 2 and Propofol infusion, Treatment may vary due to age or medical condition, Ondansetron and Dexamethasone  Airway Management Planned: Natural Airway  Additional Equipment:   Intra-op Plan:   Post-operative Plan:   Informed Consent: I have reviewed the patients History and Physical, chart, labs and discussed the procedure including the risks, benefits and alternatives for the proposed anesthesia with the patient or authorized representative who has indicated his/her understanding and acceptance.     Dental advisory given  Plan Discussed with: CRNA  Anesthesia Plan Comments:       Anesthesia Quick Evaluation

## 2020-05-31 NOTE — Anesthesia Postprocedure Evaluation (Signed)
Anesthesia Post Note  Patient: Courtney Boyer  Procedure(s) Performed: Removal deep implants left First metatarsal and medial cuneiform (Left Foot)     Patient location during evaluation: PACU Anesthesia Type: MAC Level of consciousness: awake and alert Pain management: pain level controlled Vital Signs Assessment: post-procedure vital signs reviewed and stable Respiratory status: spontaneous breathing, nonlabored ventilation, respiratory function stable and patient connected to nasal cannula oxygen Cardiovascular status: stable and blood pressure returned to baseline Postop Assessment: no apparent nausea or vomiting Anesthetic complications: no   No complications documented.  Last Vitals:  Vitals:   05/31/20 1200 05/31/20 1226  BP: 137/73 (!) 138/94  Pulse: 66 77  Resp: 18 16  Temp:  (!) 36.3 C  SpO2: 100% 98%    Last Pain:  Vitals:   05/31/20 1213  TempSrc:   PainSc: 0-No pain                 [name] L [name]

## 2020-05-31 NOTE — H&P (Signed)
Courtney Boyer is an 87 y.o. female.   Chief Complaint: left foot pain HPI: The patient is an 87 year old female without significant past medical history.  She is status post left foot bunion correction several years ago.  She has developed a sore over the dorsum of her foot that occasionally drains.  This corresponds to the distal screw at the first TMT joint.  She desires removal of this painful implant.  An adjacent screw more proximal is also palpable.  She desires its removal as well.  Past Medical History:  Diagnosis Date  . Arthritis   . High cholesterol   . History of anemia   . Hypothyroidism   . Painful orthopaedic hardware [facility])    left foot    Past Surgical History:  Procedure Laterality Date  . BUNIONECTOMY Bilateral   . REVERSE SHOULDER ARTHROPLASTY Right 02/04/2016   Procedure: REVERSE SHOULDER ARTHROPLASTY;  Surgeon: [name], MD;  Location: [facility];  Service: Orthopedics;  Laterality: Right;  . SHOULDER ARTHROSCOPY WITH SUBACROMIAL DECOMPRESSION AND OPEN ROTATOR C Left 11/17/2014   Procedure: LEFT SHOULDER ARTHROSCOPY WITH SUBACROMIAL DECOMPRESSION, MINI OPEN ROTATOR CUFF REPAIR;  Surgeon: [name], MD;  Location: [facility];  Service: Orthopedics;  Laterality: Left;    History reviewed. No pertinent family history. Social History:  reports that she has never smoked. She has never used smokeless tobacco. She reports current alcohol use. She reports that she does not use drugs.  Allergies:  Allergies  Allergen Reactions  . No Known Allergies     Medications Prior to Admission  Medication Sig Dispense Refill  . APPLE CIDER VINEGAR PO Take by mouth.    . Biotin 1000 MCG tablet Take 1,000 mcg by mouth 3 (three) times daily.    Marland Kitchen levothyroxine (SYNTHROID, LEVOTHROID) 75 MCG tablet Take 75 mcg by mouth daily before breakfast.    . Magnesium 250 MG TABS Take 250 mg by mouth daily after breakfast.     . vitamin E 180 MG (400 UNITS) capsule Take 400 Units by mouth  daily.    Marland Kitchen zinc gluconate 50 MG tablet Take 50 mg by mouth daily.      Results for orders placed or performed during the hospital encounter of 05/29/20 (from the past 48 hour(s))  SARS CORONAVIRUS 2 (TAT 6-24 HRS) Nasopharyngeal Nasopharyngeal Swab     Status: None   Collection Time: 05/29/20 12:50 PM   Specimen: Nasopharyngeal Swab  Result Value Ref Range   SARS Coronavirus 2 NEGATIVE NEGATIVE    Comment: (NOTE) SARS-CoV-2 target nucleic acids are NOT DETECTED.  The SARS-CoV-2 RNA is generally detectable in upper and lower respiratory specimens during the acute phase of infection. Negative results do not preclude SARS-CoV-2 infection, do not rule out co-infections with other pathogens, and should not be used as the sole basis for treatment or other patient management decisions. Negative results must be combined with clinical observations, patient history, and epidemiological information. The expected result is Negative.  Fact Sheet for Patients: SugarRoll.be  Fact Sheet for Healthcare Providers: https://www.woods-mathews.com/  This test is not yet approved or cleared by the [place] FDA and  has been authorized for detection and/or diagnosis of SARS-CoV-2 by FDA under an Emergency Use Authorization (EUA). This EUA will remain  in effect (meaning this test can be used) for the duration of the COVID-19 declaration under Se ction 564(b)(1) of the Act, 21 U.S.C. section 360bbb-3(b)(1), unless the authorization is terminated or revoked sooner.  Performed at [facility]  Hospital Lab, [place] [address], [place], [place] [address]    No results found.  Review of Systems no recent fever, chills, nausea, vomiting or changes in her appetite  Blood pressure (!) 155/62, pulse 62, temperature 97.6 F (36.4 C), temperature source Oral, resp. rate 18, height 5\' 2"  (1.575 m), weight 61.7 kg, SpO2 100 %. Physical Exam  Well-nourished well-developed  woman in no apparent distress.  Alert and oriented x4.  Mood and affect are normal.  Extraocular motions are intact.  Respirations are unlabored.  Left foot has healed surgical incisions over the first TMT joint.  Screw head is palpable in an area where there is a bursa and a superficial ulcer.  At the medial cuneiform there is also a palpable screw head but no ulcer.  Sensibility to light touch is intact dorsally and plantarly at the forefoot.  No lymphadenopathy.  No signs of cellulitis or abscess.   Assessment/Plan Painful hardware left medial cuneiform and first metatarsal to the operating room today for removal of deep implants from the medial cuneiform and first metatarsal.  The risks and benefits of the alternative treatment options have been discussed in detail.  The patient wishes to proceed with surgery and specifically understands risks of bleeding, infection, nerve damage, blood clots, need for additional surgery, amputation and death.   Wylene Simmer, MD 06-24-2020, 10:37 AM

## 2020-05-31 NOTE — Discharge Instructions (Signed)
Wylene Simmer, MD [facility]  Please read the following information regarding your care after surgery.  Medications  You only need a prescription for the narcotic pain medicine (ex. oxycodone, Percocet, Norco).  All of the other medicines listed below are available over the counter. X Aleve 2 pills twice a day for the first 3 days after surgery. X acetominophen (Tylenol) 650 mg every 4-6 hours as you need for minor to moderate pain   Weight Bearing X Bear weight when you are able on your operated leg or foot.   Cast / Splint / Dressing X Remove your dressing 3 days after surgery and cover the incisions with dry dressings.    After your dressing, cast or splint is removed; you may shower, but do not soak or scrub the wound.  Allow the water to run over it, and then gently pat it dry.  Swelling It is normal for you to have swelling where you had surgery.  To reduce swelling and pain, keep your toes above your nose for at least 3 days after surgery.  It may be necessary to keep your foot or leg elevated for several weeks.  If it hurts, it should be elevated.  Follow Up Call my office at [phone] when you are discharged from the hospital or surgery center to schedule an appointment to be seen two weeks after surgery.  Call my office at [phone] if you develop a fever >101.5 F, nausea, vomiting, bleeding from the surgical site or severe pain.     Post Anesthesia Home Care Instructions  Activity: Get plenty of rest for the remainder of the day. A responsible individual must stay with you for 24 hours following the procedure.  For the next 24 hours, DO NOT: -Drive a car -Paediatric nurse -Drink alcoholic beverages -Take any medication unless instructed by your physician -Make any legal decisions or sign important papers.  Meals: Start with liquid foods such as gelatin or soup. Progress to regular foods as tolerated. Avoid greasy, spicy, heavy foods. If nausea and/or  vomiting occur, drink only clear liquids until the nausea and/or vomiting subsides. Call your physician if vomiting continues.  Special Instructions/Symptoms: Your throat may feel dry or sore from the anesthesia or the breathing tube placed in your throat during surgery. If this causes discomfort, gargle with warm salt water. The discomfort should disappear within 24 hours.  If you had a scopolamine patch placed behind your ear for the management of post- operative nausea and/or vomiting:  1. The medication in the patch is effective for 72 hours, after which it should be removed.  Wrap patch in a tissue and discard in the trash. Wash hands thoroughly with soap and water. 2. You may remove the patch earlier than 72 hours if you experience unpleasant side effects which may include dry mouth, dizziness or visual disturbances. 3. Avoid touching the patch. Wash your hands with soap and water after contact with the patch.

## 2020-05-31 NOTE — Op Note (Signed)
05/31/2020  11:32 AM  PATIENT:  Courtney Boyer  87 y.o. female  PRE-OPERATIVE DIAGNOSIS: Painful hardware left foot medial cuneiform and first metatarsal  POST-OPERATIVE DIAGNOSIS: Same  Procedure(s): 1.  Removal of deep implants from the left first metatarsal proximally and distally 2.  Removal of deep implant from the left medial cuneiform  SURGEON:  Wylene Simmer, MD  ASSISTANT: Mechele Claude, PA-C  ANESTHESIA:   Local, MAC  EBL:  minimal   TOURNIQUET:   Total Tourniquet Time Documented: Calf (Left) - 11 minutes Total: Calf (Left) - 11 minutes  COMPLICATIONS:  None apparent  DISPOSITION:  Extubated, awake and stable to recovery.  INDICATION FOR PROCEDURE: The patient is an 87 year old female who has a history of Lapidus bunion correction on the left many years ago.  She has developed a painful sore over the dorsal midfoot with shoewear.  This correlates to the presence of a prominent screw head and the base of the first metatarsal.  She also has a palpable screw head at the medial cuneiform and also at the medial first metatarsal.  She has failed nonoperative treatment including shoewear modification and activity modification.  She desires removal of these 3 painful implants.  The risks and benefits of the alternative treatment options have been discussed in detail.  The patient wishes to proceed with surgery and specifically understands risks of bleeding, infection, nerve damage, blood clots, need for additional surgery, amputation and death.  PROCEDURE IN DETAIL:  After pre operative consent was obtained, and the correct operative site was identified, the patient was brought to the operating room and placed supine on the OR table.  Local Anesthesia was administered following IV sedation.  Pre-operative antibiotics were administered.  A surgical timeout was taken.  The left lower extremity was prepped and draped in standard sterile fashion.  Foot was exsanguinated and a 4 inch  Esmarch tourniquet wrapped around the ankle.  The prominent screw head was palpated dorsally.  An incision was made excising the dorsal ulcer.  A bursa was also excised.  This was passed off the field as a specimen to pathology.  The screw head was identified.  A curette was used to remove overgrown bone.  The screw was removed from the first metatarsal proximally without difficulty.  The distal screw in the first metatarsal was also palpated medially.  A small incision was made and dissection was carried down to the screw head.  The screw head was cleared of all soft tissue and overgrown bone.  The screw was removed without difficulty.  Attention was turned to the dorsal aspect of the medial cuneiform.  The screw head was palpated and a small incision made.  Dissection was carried down through the subcutaneous tissues.  The screw head was cleaned of all soft tissue and overgrown bone.  Screw was removed without difficulty.  All 3 wounds were then irrigated copiously.  The incisions were sprinkled with vancomycin powder and closed with nylon.  Sterile dressings were applied followed by compression wrap.  The tourniquet was released after application of the dressings.  Patient was awakened from anesthesia and transported to the recovery room in stable condition.   FOLLOW UP PLAN: Weightbearing as tolerated in a flat postop shoe.  Follow-up in the office in 2 weeks for suture removal.     Mechele Claude PA-C was present and scrubbed for the duration of the operative case. His assistance was essential in positioning the patient, prepping and draping, gaining and maintaining exposure, performing  the operation, closing and dressing the wounds and applying the splint.

## 2020-05-31 NOTE — Anesthesia Procedure Notes (Signed)
Procedure Name: MAC Date/Time: 05/31/2020 11:07 AM Performed by: Signe Colt, CRNA Pre-anesthesia Checklist: Patient identified, Emergency Drugs available, Suction available, Patient being monitored and Timeout performed Patient Re-evaluated:Patient Re-evaluated prior to induction Oxygen Delivery Method: Simple face mask

## 2020-06-04 ENCOUNTER — Encounter (HOSPITAL_BASED_OUTPATIENT_CLINIC_OR_DEPARTMENT_OTHER): Payer: Self-pay | Admitting: Orthopedic Surgery

## 2020-06-04 LAB — SURGICAL PATHOLOGY

## 2020-07-18 DIAGNOSIS — E785 Hyperlipidemia, unspecified: Secondary | ICD-10-CM | POA: Diagnosis not present

## 2020-07-18 DIAGNOSIS — E039 Hypothyroidism, unspecified: Secondary | ICD-10-CM | POA: Diagnosis not present

## 2020-07-18 DIAGNOSIS — Z0000 Encounter for general adult medical examination without abnormal findings: Secondary | ICD-10-CM | POA: Diagnosis not present

## 2020-07-18 DIAGNOSIS — M810 Age-related osteoporosis without current pathological fracture: Secondary | ICD-10-CM | POA: Diagnosis not present

## 2020-07-25 DIAGNOSIS — J45909 Unspecified asthma, uncomplicated: Secondary | ICD-10-CM | POA: Diagnosis not present

## 2020-07-25 DIAGNOSIS — Z1389 Encounter for screening for other disorder: Secondary | ICD-10-CM | POA: Diagnosis not present

## 2020-07-25 DIAGNOSIS — E785 Hyperlipidemia, unspecified: Secondary | ICD-10-CM | POA: Diagnosis not present

## 2020-07-25 DIAGNOSIS — M810 Age-related osteoporosis without current pathological fracture: Secondary | ICD-10-CM | POA: Diagnosis not present

## 2020-07-25 DIAGNOSIS — E039 Hypothyroidism, unspecified: Secondary | ICD-10-CM | POA: Diagnosis not present

## 2020-07-25 DIAGNOSIS — Z0000 Encounter for general adult medical examination without abnormal findings: Secondary | ICD-10-CM | POA: Diagnosis not present

## 2020-07-25 DIAGNOSIS — H9193 Unspecified hearing loss, bilateral: Secondary | ICD-10-CM | POA: Diagnosis not present

## 2020-07-25 DIAGNOSIS — R82998 Other abnormal findings in urine: Secondary | ICD-10-CM | POA: Diagnosis not present

## 2020-07-25 DIAGNOSIS — Z1331 Encounter for screening for depression: Secondary | ICD-10-CM | POA: Diagnosis not present

## 2020-07-27 DIAGNOSIS — H25811 Combined forms of age-related cataract, right eye: Secondary | ICD-10-CM | POA: Diagnosis not present

## 2020-07-27 DIAGNOSIS — H2511 Age-related nuclear cataract, right eye: Secondary | ICD-10-CM | POA: Diagnosis not present

## 2020-08-07 DIAGNOSIS — H2512 Age-related nuclear cataract, left eye: Secondary | ICD-10-CM | POA: Diagnosis not present

## 2020-08-10 ENCOUNTER — Other Ambulatory Visit (HOSPITAL_COMMUNITY): Payer: Self-pay | Admitting: *Deleted

## 2020-08-10 DIAGNOSIS — H25812 Combined forms of age-related cataract, left eye: Secondary | ICD-10-CM | POA: Diagnosis not present

## 2020-08-10 DIAGNOSIS — H2512 Age-related nuclear cataract, left eye: Secondary | ICD-10-CM | POA: Diagnosis not present

## 2020-08-13 ENCOUNTER — Encounter (HOSPITAL_COMMUNITY): Payer: PPO

## 2020-08-16 ENCOUNTER — Ambulatory Visit (HOSPITAL_COMMUNITY)
Admission: RE | Admit: 2020-08-16 | Discharge: 2020-08-16 | Disposition: A | Discharge status: Home / Self Care | Payer: PPO | Source: Ambulatory Visit | Attending: Internal Medicine | Admitting: Internal Medicine

## 2020-08-16 ENCOUNTER — Other Ambulatory Visit: Payer: Self-pay

## 2020-08-16 DIAGNOSIS — M810 Age-related osteoporosis without current pathological fracture: Secondary | ICD-10-CM | POA: Insufficient documentation

## 2020-08-16 MED ORDER — DENOSUMAB 60 MG/ML ~~LOC~~ SOSY
PREFILLED_SYRINGE | SUBCUTANEOUS | Status: AC
  Administered 2020-08-16: 60 mg via SUBCUTANEOUS
  Filled 2020-08-16: qty 1

## 2020-08-16 MED ORDER — DENOSUMAB 60 MG/ML ~~LOC~~ SOSY: 60.0000 mg | PREFILLED_SYRINGE | Freq: Once | SUBCUTANEOUS | Status: AC

## 2020-09-05 DIAGNOSIS — L820 Inflamed seborrheic keratosis: Secondary | ICD-10-CM | POA: Diagnosis not present

## 2020-09-05 DIAGNOSIS — D229 Melanocytic nevi, unspecified: Secondary | ICD-10-CM | POA: Diagnosis not present

## 2020-09-05 DIAGNOSIS — L821 Other seborrheic keratosis: Secondary | ICD-10-CM | POA: Diagnosis not present

## 2020-09-05 DIAGNOSIS — Z85828 Personal history of other malignant neoplasm of skin: Secondary | ICD-10-CM | POA: Diagnosis not present

## 2020-09-05 DIAGNOSIS — L905 Scar conditions and fibrosis of skin: Secondary | ICD-10-CM | POA: Diagnosis not present

## 2020-09-05 DIAGNOSIS — L814 Other melanin hyperpigmentation: Secondary | ICD-10-CM | POA: Diagnosis not present

## 2020-09-05 DIAGNOSIS — L309 Dermatitis, unspecified: Secondary | ICD-10-CM | POA: Diagnosis not present

## 2020-09-05 DIAGNOSIS — D485 Neoplasm of uncertain behavior of skin: Secondary | ICD-10-CM | POA: Diagnosis not present

## 2021-06-24 ENCOUNTER — Encounter: Payer: Self-pay | Admitting: Podiatry

## 2021-06-24 ENCOUNTER — Ambulatory Visit: Payer: PPO | Admitting: Podiatry

## 2021-06-24 DIAGNOSIS — L84 Corns and callosities: Secondary | ICD-10-CM | POA: Diagnosis not present

## 2021-06-24 NOTE — Progress Notes (Signed)
This patient presents to the office with painful fifth toe left foot.  She says she has been experiencing pain for 4-5 months.  No history of trauma or injury.  She says she has developed a corn on her fifth toe.  She has tried medicated corn pads.  She presents to the office for evaluation and treatment.  ? ?Vascular  Dorsalis pedis and posterior tibial pulses are palpable  B/L.  Capillary return  WNL.  Temperature gradient is  WNL.  Skin turgor  WNL ? ?Sensorium  Senn Weinstein monofilament wire  WNL. Normal tactile sensation. ? ?Nail Exam  Patient has normal nails with no evidence of bacterial or fungal infection. ? ?Orthopedic  Exam  Muscle tone and muscle strength  WNL.  No limitations of motion feet  B/L.  No crepitus or joint effusion noted.  Foot type is unremarkable and digits show no abnormalities.  Bony prominences are unremarkable.  No ROM  PIPJ fifth toe left foot.  PIPJ is enlarged. ? ?Skin  No open lesions.  Normal skin texture and turgor. Heloma durum fifth toe left foot. ? ?Heloma durum fifth toe left foot. ? ?IE.  Debride corn.  Discussed condition with patient.  Debride HD with # 15 blade.  Padding dispensed.  Told patient there was possible fractrure.  If problem persists whe needs to reappoint with one of the surgeons in the practice. ? ? ?Gardiner Barefoot DPM  ?

## 2021-08-08 DIAGNOSIS — R7989 Other specified abnormal findings of blood chemistry: Secondary | ICD-10-CM | POA: Diagnosis not present

## 2021-08-08 DIAGNOSIS — E785 Hyperlipidemia, unspecified: Secondary | ICD-10-CM | POA: Diagnosis not present

## 2021-08-08 DIAGNOSIS — E039 Hypothyroidism, unspecified: Secondary | ICD-10-CM | POA: Diagnosis not present

## 2021-08-08 DIAGNOSIS — M810 Age-related osteoporosis without current pathological fracture: Secondary | ICD-10-CM | POA: Diagnosis not present

## 2021-08-12 DIAGNOSIS — M810 Age-related osteoporosis without current pathological fracture: Secondary | ICD-10-CM | POA: Diagnosis not present

## 2021-08-16 DIAGNOSIS — E039 Hypothyroidism, unspecified: Secondary | ICD-10-CM | POA: Diagnosis not present

## 2021-08-16 DIAGNOSIS — H9193 Unspecified hearing loss, bilateral: Secondary | ICD-10-CM | POA: Diagnosis not present

## 2021-08-16 DIAGNOSIS — E785 Hyperlipidemia, unspecified: Secondary | ICD-10-CM | POA: Diagnosis not present

## 2021-08-16 DIAGNOSIS — Z23 Encounter for immunization: Secondary | ICD-10-CM | POA: Diagnosis not present

## 2021-08-16 DIAGNOSIS — Z1339 Encounter for screening examination for other mental health and behavioral disorders: Secondary | ICD-10-CM | POA: Diagnosis not present

## 2021-08-16 DIAGNOSIS — Z0000 Encounter for general adult medical examination without abnormal findings: Secondary | ICD-10-CM | POA: Diagnosis not present

## 2021-08-16 DIAGNOSIS — M810 Age-related osteoporosis without current pathological fracture: Secondary | ICD-10-CM | POA: Diagnosis not present

## 2021-08-16 DIAGNOSIS — R413 Other amnesia: Secondary | ICD-10-CM | POA: Diagnosis not present

## 2021-08-16 DIAGNOSIS — L299 Pruritus, unspecified: Secondary | ICD-10-CM | POA: Diagnosis not present

## 2021-08-16 DIAGNOSIS — Z1331 Encounter for screening for depression: Secondary | ICD-10-CM | POA: Diagnosis not present

## 2021-08-26 DIAGNOSIS — H3530 Unspecified macular degeneration: Secondary | ICD-10-CM | POA: Diagnosis not present

## 2021-08-28 ENCOUNTER — Other Ambulatory Visit (HOSPITAL_COMMUNITY): Payer: Self-pay | Admitting: *Deleted

## 2021-08-29 ENCOUNTER — Ambulatory Visit (HOSPITAL_COMMUNITY)
Admission: RE | Admit: 2021-08-29 | Discharge: 2021-08-29 | Disposition: A | Discharge status: Home / Self Care | Payer: PPO | Source: Ambulatory Visit | Attending: Internal Medicine | Admitting: Internal Medicine

## 2021-08-29 DIAGNOSIS — M810 Age-related osteoporosis without current pathological fracture: Secondary | ICD-10-CM | POA: Insufficient documentation

## 2021-08-29 MED ORDER — DENOSUMAB 60 MG/ML ~~LOC~~ SOSY: 60.0000 mg | PREFILLED_SYRINGE | Freq: Once | SUBCUTANEOUS | Status: AC

## 2021-08-29 MED ORDER — DENOSUMAB 60 MG/ML ~~LOC~~ SOSY
PREFILLED_SYRINGE | SUBCUTANEOUS | Status: AC
  Administered 2021-08-29: 60 mg via SUBCUTANEOUS
  Filled 2021-08-29: qty 1

## 2021-09-05 DIAGNOSIS — Z08 Encounter for follow-up examination after completed treatment for malignant neoplasm: Secondary | ICD-10-CM | POA: Diagnosis not present

## 2021-09-05 DIAGNOSIS — D225 Melanocytic nevi of trunk: Secondary | ICD-10-CM | POA: Diagnosis not present

## 2021-09-05 DIAGNOSIS — L821 Other seborrheic keratosis: Secondary | ICD-10-CM | POA: Diagnosis not present

## 2021-09-05 DIAGNOSIS — Z85828 Personal history of other malignant neoplasm of skin: Secondary | ICD-10-CM | POA: Diagnosis not present

## 2021-09-05 DIAGNOSIS — L570 Actinic keratosis: Secondary | ICD-10-CM | POA: Diagnosis not present

## 2021-09-05 DIAGNOSIS — L814 Other melanin hyperpigmentation: Secondary | ICD-10-CM | POA: Diagnosis not present

## 2022-09-03 DIAGNOSIS — H40013 Open angle with borderline findings, low risk, bilateral: Secondary | ICD-10-CM | POA: Diagnosis not present

## 2022-09-03 DIAGNOSIS — H43811 Vitreous degeneration, right eye: Secondary | ICD-10-CM | POA: Diagnosis not present

## 2022-09-03 DIAGNOSIS — Z961 Presence of intraocular lens: Secondary | ICD-10-CM | POA: Diagnosis not present

## 2022-09-08 DIAGNOSIS — E785 Hyperlipidemia, unspecified: Secondary | ICD-10-CM | POA: Diagnosis not present

## 2022-09-08 DIAGNOSIS — M810 Age-related osteoporosis without current pathological fracture: Secondary | ICD-10-CM | POA: Diagnosis not present

## 2022-09-08 DIAGNOSIS — E039 Hypothyroidism, unspecified: Secondary | ICD-10-CM | POA: Diagnosis not present

## 2022-09-18 DIAGNOSIS — H9193 Unspecified hearing loss, bilateral: Secondary | ICD-10-CM | POA: Diagnosis not present

## 2022-09-18 DIAGNOSIS — Z1339 Encounter for screening examination for other mental health and behavioral disorders: Secondary | ICD-10-CM | POA: Diagnosis not present

## 2022-09-18 DIAGNOSIS — R413 Other amnesia: Secondary | ICD-10-CM | POA: Diagnosis not present

## 2022-09-18 DIAGNOSIS — Z1331 Encounter for screening for depression: Secondary | ICD-10-CM | POA: Diagnosis not present

## 2022-09-18 DIAGNOSIS — M810 Age-related osteoporosis without current pathological fracture: Secondary | ICD-10-CM | POA: Diagnosis not present

## 2022-09-18 DIAGNOSIS — E039 Hypothyroidism, unspecified: Secondary | ICD-10-CM | POA: Diagnosis not present

## 2022-09-18 DIAGNOSIS — Z0000 Encounter for general adult medical examination without abnormal findings: Secondary | ICD-10-CM | POA: Diagnosis not present

## 2022-09-18 DIAGNOSIS — E785 Hyperlipidemia, unspecified: Secondary | ICD-10-CM | POA: Diagnosis not present

## 2022-09-18 DIAGNOSIS — J45909 Unspecified asthma, uncomplicated: Secondary | ICD-10-CM | POA: Diagnosis not present

## 2022-09-22 ENCOUNTER — Other Ambulatory Visit (HOSPITAL_COMMUNITY): Payer: Self-pay | Admitting: *Deleted

## 2022-09-29 ENCOUNTER — Ambulatory Visit (HOSPITAL_COMMUNITY)
Admission: RE | Admit: 2022-09-29 | Discharge: 2022-09-29 | Disposition: A | Discharge status: Home / Self Care | Payer: PPO | Source: Ambulatory Visit | Attending: Internal Medicine | Admitting: Internal Medicine

## 2022-09-29 DIAGNOSIS — M810 Age-related osteoporosis without current pathological fracture: Secondary | ICD-10-CM | POA: Insufficient documentation

## 2022-09-29 DIAGNOSIS — Z79620 Long term (current) use of immunosuppressive biologic: Secondary | ICD-10-CM | POA: Insufficient documentation

## 2022-09-29 MED ORDER — DENOSUMAB 60 MG/ML ~~LOC~~ SOSY
60.0000 mg | PREFILLED_SYRINGE | Freq: Once | SUBCUTANEOUS | Status: AC
  Administered 2022-09-29: 60 mg via SUBCUTANEOUS

## 2022-09-29 MED ORDER — DENOSUMAB 60 MG/ML ~~LOC~~ SOSY
PREFILLED_SYRINGE | SUBCUTANEOUS | Status: AC
  Filled 2022-09-29: qty 1

## 2022-10-30 DIAGNOSIS — H903 Sensorineural hearing loss, bilateral: Secondary | ICD-10-CM | POA: Diagnosis not present

## 2022-10-30 DIAGNOSIS — H938X2 Other specified disorders of left ear: Secondary | ICD-10-CM | POA: Diagnosis not present

## 2022-10-30 DIAGNOSIS — H6122 Impacted cerumen, left ear: Secondary | ICD-10-CM | POA: Diagnosis not present

## 2023-01-07 DIAGNOSIS — H903 Sensorineural hearing loss, bilateral: Secondary | ICD-10-CM | POA: Diagnosis not present

## 2023-04-28 ENCOUNTER — Other Ambulatory Visit (HOSPITAL_COMMUNITY): Payer: Self-pay | Admitting: *Deleted

## 2023-04-30 ENCOUNTER — Ambulatory Visit (HOSPITAL_COMMUNITY)
Admission: RE | Admit: 2023-04-30 | Discharge: 2023-04-30 | Disposition: A | Discharge status: Home / Self Care | Source: Ambulatory Visit | Attending: Internal Medicine | Admitting: Internal Medicine

## 2023-04-30 DIAGNOSIS — Z79620 Long term (current) use of immunosuppressive biologic: Secondary | ICD-10-CM | POA: Insufficient documentation

## 2023-04-30 DIAGNOSIS — M810 Age-related osteoporosis without current pathological fracture: Secondary | ICD-10-CM | POA: Insufficient documentation

## 2023-04-30 MED ORDER — DENOSUMAB 60 MG/ML ~~LOC~~ SOSY
60.0000 mg | PREFILLED_SYRINGE | Freq: Once | SUBCUTANEOUS | Status: AC
  Administered 2023-04-30: 60 mg via SUBCUTANEOUS

## 2023-04-30 MED ORDER — DENOSUMAB 60 MG/ML ~~LOC~~ SOSY
PREFILLED_SYRINGE | SUBCUTANEOUS | Status: AC
  Filled 2023-04-30: qty 1

## 2023-05-20 DIAGNOSIS — L821 Other seborrheic keratosis: Secondary | ICD-10-CM | POA: Diagnosis not present

## 2023-05-20 DIAGNOSIS — L720 Epidermal cyst: Secondary | ICD-10-CM | POA: Diagnosis not present

## 2023-05-20 DIAGNOSIS — L538 Other specified erythematous conditions: Secondary | ICD-10-CM | POA: Diagnosis not present

## 2023-05-20 DIAGNOSIS — L820 Inflamed seborrheic keratosis: Secondary | ICD-10-CM | POA: Diagnosis not present

## 2023-05-20 DIAGNOSIS — L2989 Other pruritus: Secondary | ICD-10-CM | POA: Diagnosis not present

## 2023-08-26 DIAGNOSIS — M2550 Pain in unspecified joint: Secondary | ICD-10-CM | POA: Diagnosis not present

## 2023-08-26 DIAGNOSIS — W57XXXA Bitten or stung by nonvenomous insect and other nonvenomous arthropods, initial encounter: Secondary | ICD-10-CM | POA: Diagnosis not present

## 2023-09-15 DIAGNOSIS — M810 Age-related osteoporosis without current pathological fracture: Secondary | ICD-10-CM | POA: Diagnosis not present

## 2023-09-15 DIAGNOSIS — E039 Hypothyroidism, unspecified: Secondary | ICD-10-CM | POA: Diagnosis not present

## 2023-09-15 DIAGNOSIS — E785 Hyperlipidemia, unspecified: Secondary | ICD-10-CM | POA: Diagnosis not present

## 2023-09-15 DIAGNOSIS — E7849 Other hyperlipidemia: Secondary | ICD-10-CM | POA: Diagnosis not present

## 2023-09-23 DIAGNOSIS — R413 Other amnesia: Secondary | ICD-10-CM | POA: Diagnosis not present

## 2023-09-23 DIAGNOSIS — J45909 Unspecified asthma, uncomplicated: Secondary | ICD-10-CM | POA: Diagnosis not present

## 2023-09-23 DIAGNOSIS — H9193 Unspecified hearing loss, bilateral: Secondary | ICD-10-CM | POA: Diagnosis not present

## 2023-09-23 DIAGNOSIS — Z1339 Encounter for screening examination for other mental health and behavioral disorders: Secondary | ICD-10-CM | POA: Diagnosis not present

## 2023-09-23 DIAGNOSIS — E039 Hypothyroidism, unspecified: Secondary | ICD-10-CM | POA: Diagnosis not present

## 2023-09-23 DIAGNOSIS — Z0000 Encounter for general adult medical examination without abnormal findings: Secondary | ICD-10-CM | POA: Diagnosis not present

## 2023-09-23 DIAGNOSIS — Z1331 Encounter for screening for depression: Secondary | ICD-10-CM | POA: Diagnosis not present

## 2023-09-23 DIAGNOSIS — E785 Hyperlipidemia, unspecified: Secondary | ICD-10-CM | POA: Diagnosis not present

## 2023-09-23 DIAGNOSIS — M810 Age-related osteoporosis without current pathological fracture: Secondary | ICD-10-CM | POA: Diagnosis not present

## 2023-09-23 DIAGNOSIS — N1831 Chronic kidney disease, stage 3a: Secondary | ICD-10-CM | POA: Diagnosis not present

## 2023-09-23 DIAGNOSIS — M25561 Pain in right knee: Secondary | ICD-10-CM | POA: Diagnosis not present

## 2023-09-29 ENCOUNTER — Other Ambulatory Visit: Payer: Self-pay

## 2023-09-29 ENCOUNTER — Emergency Department (HOSPITAL_BASED_OUTPATIENT_CLINIC_OR_DEPARTMENT_OTHER)
Admission: EM | Admit: 2023-09-29 | Discharge: 2023-09-29 | Disposition: A | Discharge status: Home / Self Care | Attending: Emergency Medicine | Admitting: Emergency Medicine

## 2023-09-29 ENCOUNTER — Encounter (HOSPITAL_BASED_OUTPATIENT_CLINIC_OR_DEPARTMENT_OTHER): Payer: Self-pay

## 2023-09-29 ENCOUNTER — Emergency Department (HOSPITAL_BASED_OUTPATIENT_CLINIC_OR_DEPARTMENT_OTHER): Admitting: Radiology

## 2023-09-29 DIAGNOSIS — W01198A Fall on same level from slipping, tripping and stumbling with subsequent striking against other object, initial encounter: Secondary | ICD-10-CM | POA: Insufficient documentation

## 2023-09-29 DIAGNOSIS — R0781 Pleurodynia: Secondary | ICD-10-CM | POA: Diagnosis present

## 2023-09-29 DIAGNOSIS — E039 Hypothyroidism, unspecified: Secondary | ICD-10-CM | POA: Diagnosis not present

## 2023-09-29 DIAGNOSIS — Y9301 Activity, walking, marching and hiking: Secondary | ICD-10-CM | POA: Insufficient documentation

## 2023-09-29 DIAGNOSIS — Z79899 Other long term (current) drug therapy: Secondary | ICD-10-CM | POA: Insufficient documentation

## 2023-09-29 DIAGNOSIS — S2231XA Fracture of one rib, right side, initial encounter for closed fracture: Secondary | ICD-10-CM

## 2023-09-29 LAB — BASIC METABOLIC PANEL WITH GFR
Anion gap: 14 (ref 5–15)
BUN: 17 mg/dL (ref 8–23)
CO2: 25 mmol/L (ref 22–32)
Calcium: 9.7 mg/dL (ref 8.9–10.3)
Chloride: 99 mmol/L (ref 98–111)
Creatinine, Ser: 0.87 mg/dL (ref 0.44–1.00)
GFR, Estimated: >60 mL/min (ref >60)
Glucose, Bld: 99 mg/dL (ref 70–99)
Potassium: 4.3 mmol/L (ref 3.5–5.1)
Sodium: 137 mmol/L (ref 135–145)

## 2023-09-29 LAB — CBC
HCT: 40.1 % (ref 36.0–46.0)
Hemoglobin: 13.2 g/dL (ref 12.0–15.0)
MCH: 29.5 pg (ref 26.0–34.0)
MCHC: 32.9 g/dL (ref 30.0–36.0)
MCV: 89.7 fL (ref 80.0–100.0)
Platelets: 313 K/uL (ref 150–400)
RBC: 4.47 MIL/uL (ref 3.87–5.11)
RDW: 13.3 % (ref 11.5–15.5)
WBC: 7.6 K/uL (ref 4.0–10.5)
nRBC: 0 % (ref 0.0–0.2)

## 2023-09-29 MED ORDER — ONDANSETRON HCL 4 MG/2ML IJ SOLN
4.0000 mg | Freq: Once | INTRAMUSCULAR | Status: AC
  Administered 2023-09-29 (×2): 4 mg via INTRAVENOUS
  Filled 2023-09-29: qty 2

## 2023-09-29 MED ORDER — HYDROCODONE-ACETAMINOPHEN 5-325 MG PO TABS: 1.0000 | ORAL_TABLET | Freq: Four times a day (QID) | ORAL | 0 refills | Status: DC | PRN

## 2023-09-29 MED ORDER — LIDOCAINE 5 % EX PTCH: 1.0000 | MEDICATED_PATCH | CUTANEOUS | 0 refills | Status: DC

## 2023-09-29 MED ORDER — MORPHINE SULFATE (PF) 4 MG/ML IV SOLN
4.0000 mg | Freq: Once | INTRAVENOUS | Status: AC
  Administered 2023-09-29 (×2): 4 mg via INTRAVENOUS
  Filled 2023-09-29: qty 1

## 2023-09-29 MED ORDER — KETOROLAC TROMETHAMINE 30 MG/ML IJ SOLN
15.0000 mg | Freq: Once | INTRAMUSCULAR | Status: AC
  Administered 2023-09-29 (×2): 15 mg via INTRAVENOUS
  Filled 2023-09-29: qty 1

## 2023-09-29 NOTE — ED Provider Notes (Signed)
 [facility] EMERGENCY DEPARTMENT AT [facility] Provider Note   CSN: [id number] Arrival date & time: 09/29/23  1136     Patient presents with: Back Pain   Courtney Boyer is a 89 y.o. female.    Back Pain    Patient has a history of hypothyroidism hypercholesterolemia anemia.  Patient states she was walking more and a door pushed into her which caused her to fall into a metal railing.  Patient did not hit her head or lose consciousness.  Patient initially did not have any significant pain but over the last couple hours has started developing increasing pain in the right rib area.  Patient states it hurts for her to take a deep breath or move around.  The pain is sharp.  Prior to Admission medications   Medication Sig Start Date End Date Taking? Authorizing Provider  HYDROcodone -acetaminophen  (NORCO/VICODIN) 5-325 MG tablet Take 1 tablet by mouth every 6 (six) hours as needed. 09/29/23  Yes Randol Simmonds, MD  lidocaine  (LIDODERM ) 5 % Place 1 patch onto the skin daily. Remove & Discard patch within 12 hours or as directed by MD 09/29/23  Yes Randol Simmonds, MD  APPLE CIDER VINEGAR PO Take by mouth.    [provider]  Biotin 1000 MCG tablet Take 1,000 mcg by mouth 3 (three) times daily.    [provider]  levothyroxine  (SYNTHROID , LEVOTHROID) 75 MCG tablet Take 75 mcg by mouth daily before breakfast.    [provider]  Magnesium  250 MG TABS Take 250 mg by mouth daily after breakfast.     [provider]  vitamin E 180 MG (400 UNITS) capsule Take 400 Units by mouth daily.    [provider]  zinc gluconate 50 MG tablet Take 50 mg by mouth daily.    [provider]    Allergies: No known allergies    Review of Systems  Musculoskeletal:  Positive for back pain.    Updated Vital Signs BP (!) 156/82   Pulse (!) 57   Temp 97.7 F (36.5 C) (Oral)   Resp 16   SpO2 100%   Physical Exam Vitals and nursing note reviewed.   Constitutional:      General: She is not in acute distress.    Appearance: She is well-developed.  HENT:     Head: Normocephalic and atraumatic.     Right Ear: External ear normal.     Left Ear: External ear normal.  Eyes:     General: No scleral icterus.       Right eye: No discharge.        Left eye: No discharge.     Conjunctiva/sclera: Conjunctivae normal.  Neck:     Trachea: No tracheal deviation.  Cardiovascular:     Rate and Rhythm: Normal rate.  Pulmonary:     Effort: Pulmonary effort is normal. No respiratory distress.     Breath sounds: No stridor.  Chest:     Chest wall: Tenderness present. No deformity, swelling or crepitus.     Comments: Tenderness to palpation right posterior and lateral ribs, no crepitus, no bruising Abdominal:     General: There is no distension.  Musculoskeletal:        General: No swelling or deformity.     Cervical back: Neck supple.  Skin:    General: Skin is warm and dry.     Findings: No rash.  Neurological:     Mental Status: She is alert. Mental status is  at baseline.     Cranial Nerves: No dysarthria or facial asymmetry.     Motor: No seizure activity.     (all labs ordered are listed, but only abnormal results are displayed) Labs Reviewed  CBC  BASIC METABOLIC PANEL WITH GFR    EKG: None  Radiology: DG Ribs Unilateral W/Chest Right Result Date: 09/29/2023 CLINICAL DATA:  Posterior rib pain, fell into railing EXAM: RIGHT RIBS AND CHEST - 3+ VIEW COMPARISON:  None Available. FINDINGS: Acute minimally displaced fracture of the right lateral ninth rib. Right reverse TSA. Plate and screw fixation in the right scapula. No radiographic evidence of loosening. Right basilar atelectasis or scarring. The lungs are otherwise clear. No pneumothorax. Normal cardiomediastinal silhouette. IMPRESSION: Acute minimally displaced fracture of the right lateral ninth rib. No pneumothorax. Electronically Signed   By: Norman Gatlin M.D.   On:  09/29/2023 13:10     Procedures   Medications Ordered in the ED  ketorolac  (TORADOL ) 30 MG/ML injection 15 mg (has no administration in time range)  morphine  (PF) 4 MG/ML injection 4 mg (4 mg Intravenous Given 09/29/23 1227)  ondansetron  (ZOFRAN ) injection 4 mg (4 mg Intravenous Given 09/29/23 1225)  morphine  (PF) 4 MG/ML injection 4 mg (4 mg Intravenous Given 09/29/23 1319)    Clinical Course as of 09/29/23 1351  Tue Sep 29, 2023  1318 X-ray shows displaced fracture of the right lateral ninth rib no pneumothorax noted [JK]    Clinical Course User Index [JK] Randol Simmonds, MD                                 Medical Decision Making Problems Addressed: Closed fracture of one rib of right side, initial encounter: acute illness or injury that poses a threat to life or bodily functions  Amount and/or Complexity of Data Reviewed Labs: ordered. Radiology: ordered.  Risk Prescription drug management.   Patient presented to the ED for evaluation after mechanical fall.  Patient hit her ribs into a railing.  Patient is not showing any signs of anemia.  Metabolic panel is unremarkable.  X-ray does not show any pneumothorax but she does have a rib fracture.  She was treated with IV morphine  for her pain.  Discussed options of being admitted to the hospital for pain control versus outpatient management.  Patient states she would prefer to go home     Final diagnoses:  Closed fracture of one rib of right side, initial encounter    ED Discharge Orders          Ordered    HYDROcodone -acetaminophen  (NORCO/VICODIN) 5-325 MG tablet  Every 6 hours PRN        09/29/23 1348    lidocaine  (LIDODERM ) 5 %  Every 24 hours        09/29/23 1348               Randol Simmonds, MD 09/29/23 1351

## 2023-09-29 NOTE — Discharge Instructions (Addendum)
 The x-ray showed you fractured your right ninth rib.  Rib fractures can be very painful.  They typically take approximately 8 weeks to heal.  Take the medications as needed for pain.  You can also take over-the-counter ibuprofen  or Naprosyn.  Return to the emergency room if start having difficulty breathing increasing pain or other concerning symptoms

## 2023-09-29 NOTE — ED Triage Notes (Signed)
 Patient reports she got hit by a door and it pushed her into a metal railing. States she did not fall and did not hit her head. Pain to right shoulder, right side of back. Pain worse with inspiration.

## 2023-10-05 DIAGNOSIS — S2231XD Fracture of one rib, right side, subsequent encounter for fracture with routine healing: Secondary | ICD-10-CM | POA: Diagnosis not present

## 2023-10-05 DIAGNOSIS — M810 Age-related osteoporosis without current pathological fracture: Secondary | ICD-10-CM | POA: Diagnosis not present

## 2023-12-01 ENCOUNTER — Encounter: Payer: Self-pay | Admitting: Family Medicine

## 2023-12-01 DIAGNOSIS — N1831 Chronic kidney disease, stage 3a: Secondary | ICD-10-CM | POA: Diagnosis not present

## 2023-12-01 DIAGNOSIS — R197 Diarrhea, unspecified: Secondary | ICD-10-CM | POA: Diagnosis not present

## 2023-12-01 DIAGNOSIS — R413 Other amnesia: Secondary | ICD-10-CM | POA: Diagnosis not present

## 2023-12-01 DIAGNOSIS — Z8249 Family history of ischemic heart disease and other diseases of the circulatory system: Secondary | ICD-10-CM | POA: Diagnosis not present

## 2023-12-01 DIAGNOSIS — D72819 Decreased white blood cell count, unspecified: Secondary | ICD-10-CM | POA: Diagnosis not present

## 2023-12-01 DIAGNOSIS — R079 Chest pain, unspecified: Secondary | ICD-10-CM | POA: Diagnosis not present

## 2023-12-01 DIAGNOSIS — R109 Unspecified abdominal pain: Secondary | ICD-10-CM | POA: Diagnosis not present

## 2023-12-03 ENCOUNTER — Other Ambulatory Visit: Payer: Self-pay | Admitting: Family Medicine

## 2023-12-03 DIAGNOSIS — Z8249 Family history of ischemic heart disease and other diseases of the circulatory system: Secondary | ICD-10-CM

## 2024-01-06 NOTE — Progress Notes (Unsigned)
 Cardiology Clinic Note   Patient Name: Courtney Boyer Date of Encounter: 01/07/2024  Primary Care Provider:  Larnell Hamilton, MD Primary Cardiologist:  None  Patient Profile    Courtney Boyer 89 year old female presents to the clinic today for evaluation of her chest pain.  Past Medical History    Past Medical History:  Diagnosis Date   Arthritis    High cholesterol    History of anemia    Hypothyroidism    Painful orthopaedic hardware    left foot   Past Surgical History:  Procedure Laterality Date   BUNIONECTOMY Bilateral    HARDWARE REMOVAL Left 05/31/2020   Procedure: Removal deep implants left First metatarsal and medial cuneiform;  Surgeon: Kit Rush, MD;  Location: [facility];  Service: Orthopedics;  Laterality: Left;   REVERSE SHOULDER ARTHROPLASTY Right 02/04/2016   Procedure: REVERSE SHOULDER ARTHROPLASTY;  Surgeon: Marcey Her, MD;  Location: [facility] OR;  Service: Orthopedics;  Laterality: Right;   SHOULDER ARTHROSCOPY WITH SUBACROMIAL DECOMPRESSION AND OPEN ROTATOR C Left 11/17/2014   Procedure: LEFT SHOULDER ARTHROSCOPY WITH SUBACROMIAL DECOMPRESSION, MINI OPEN ROTATOR CUFF REPAIR;  Surgeon: Marcey Her, MD;  Location: [facility] OR;  Service: Orthopedics;  Laterality: Left;    Allergies  Allergies  Allergen Reactions   No Known Allergies     History of Present Illness    Courtney Boyer has a PMH of hyperlipidemia, osteoporosis, hypothyroidism, right knee OA, and abdominal pain.  She reports a family history of heart disease with her father having coronary artery disease and MI in his 27s.  She was seen and evaluated by her PCPs office, Royden Ruth, NP) [facility] on 12/01/2023.  She reported an episode of chest pain.  The episode happened 1-1/2 weeks prior.  She reported severe heart pain.  This woke her up from her sleep.  The pain lasted for about 5 minutes.  She reported that her heart felt like it was being squeezed.   She denied previous similar episodes.  She denied subsequent episodes.  She did note that occasionally her heart would feel heavy.  She denied shortness of breath, DOE and denied symptoms since having chest pain.  She also reported a severe episode of diarrhea 3 days prior to her visit.  Her stools had returned to normal consistency.  She was referred to cardiology for further evaluation and coronary CTA.  She presents to the clinic today for evaluation and states she has not had any further episodes of chest discomfort.  We reviewed her PCP appointment and symptoms surrounding her episode of chest pain.  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  She denies exertional chest pain.  We reviewed her family history.  She describes a massive heart attack with her father while he was in his 27s.  She notes that he also had right below-knee amputation.  She reports that he did not take very good care of himself.  She does note that her mother and grandmother lived to be in their 90s-100s.  We reviewed options for further prognostication.  She agrees to proceed with echocardiogram and coronary CTA.  I will order CBC and BMP today as well.  I will plan follow-up in 2 to 3 months.  She reports that she enjoys taking care of her 2 twin grandchildren that are 2 years and 3 months.  She does this on Thursdays and Fridays.  Today she  denies chest pain, shortness of breath, lower extremity edema, fatigue, palpitations, melena, hematuria, hemoptysis, diaphoresis, weakness, presyncope, syncope, orthopnea, and PND.   Home Medications    Prior to Admission medications   Medication Sig Start Date End Date Taking? Authorizing Provider  APPLE CIDER VINEGAR PO Take by mouth.    [provider]  Biotin 1000 MCG tablet Take 1,000 mcg by mouth 3 (three) times daily.    [provider]  HYDROcodone -acetaminophen  (NORCO/VICODIN) 5-325 MG  tablet Take 1 tablet by mouth every 6 (six) hours as needed. 09/29/23   Randol Simmonds, MD  levothyroxine  (SYNTHROID , LEVOTHROID) 75 MCG tablet Take 75 mcg by mouth daily before breakfast.    [provider]  lidocaine  (LIDODERM ) 5 % Place 1 patch onto the skin daily. Remove & Discard patch within 12 hours or as directed by MD 09/29/23   Randol Simmonds, MD  Magnesium  250 MG TABS Take 250 mg by mouth daily after breakfast.     [provider]  vitamin E 180 MG (400 UNITS) capsule Take 400 Units by mouth daily.    [provider]  zinc gluconate 50 MG tablet Take 50 mg by mouth daily.    [provider]    Family History    History reviewed. No pertinent family history. has no family status information on file.   Social History    Social History   Socioeconomic History   Marital status: Widowed    Spouse name: Not on file   Number of children: Not on file   Years of education: Not on file   Highest education level: Not on file  Occupational History   Not on file  Tobacco Use   Smoking status: Never   Smokeless tobacco: Never  Substance and Sexual Activity   Alcohol  use: Yes    Comment: occassionally   Drug use: No   Sexual activity: Not on file  Other Topics Concern   Not on file  Social History Narrative   Not on file   Social Drivers of Health   Financial Resource Strain: Not on file  Food Insecurity: Not on file  Transportation Needs: Not on file  Physical Activity: Not on file  Stress: Not on file  Social Connections: Not on file  Intimate Partner Violence: Not on file     Review of Systems    General:  No chills, fever, night sweats or weight changes.  Cardiovascular:  No chest pain, dyspnea on exertion, edema, orthopnea, palpitations, paroxysmal nocturnal dyspnea. Dermatological: No rash, lesions/masses Respiratory: No cough, dyspnea Urologic: No hematuria, dysuria Abdominal:   No nausea, vomiting, diarrhea, bright red blood per  rectum, melena, or hematemesis Neurologic:  No visual changes, wkns, changes in mental status. All other systems reviewed and are otherwise negative except as noted above.  Physical Exam    VS:  BP 130/82   Pulse 73   Ht 5' 2 (1.575 m)   Wt 125 lb 6.4 oz (56.9 kg)   SpO2 98%   BMI 22.94 kg/m  , BMI Body mass index is 22.94 kg/m. GEN: Well nourished, well developed, in no acute distress. HEENT: normal. Neck: Supple, no JVD, carotid bruits, or masses. Cardiac: RRR, no murmurs, rubs, or gallops. No clubbing, cyanosis, edema.  Radials/DP/PT 2+ and equal bilaterally.  Respiratory:  Respirations regular and unlabored, clear to auscultation bilaterally. GI: Soft, nontender, nondistended, BS + x 4. MS: no deformity or atrophy. Skin: warm and dry, no rash. Neuro:  Strength and  sensation are intact. Psych: Normal affect.  Accessory Clinical Findings    Recent Labs: 09/29/2023: BUN 17; Creatinine, Ser 0.87; Hemoglobin 13.2; Platelets 313; Potassium 4.3; Sodium 137   Recent Lipid Panel No results found for: CHOL, TRIG, HDL, CHOLHDL, VLDL, LDLCALC, LDLDIRECT       ECG personally reviewed by me today-none today.     EKG 12/01/2023  Sinus bradycardia 56 bpm no ST elevation, ectopy and normal intervals.     Assessment & Plan   1.  Chest pain-no chest pain today.  Notes 1 episode of chest discomfort that happened a few weeks ago.  This woke her from her sleep.  She denies exertional chest discomfort.  She does note a family history of coronary artery disease in her father who had MI in his 22s.   Heart healthy low-sodium diet Echocardiogram Coronary CTA CBC, BMP  Hyperlipidemia-reports compliance with statin therapy High-fiber diet Increase physical activity as tolerated Continue atorvastatin  Follows with PCP  Disposition: Follow-up with Dr.Segal or me in 2-3 months.   Josefa HERO. Vannah Nadal NP-C     01/07/2024, 11:46 AM [facility]  [facility] [address] [place], [place] [address] Office [phone]    Notice: This dictation was prepared with Dragon dictation along with smaller phrase technology. Any transcriptional errors that result from this process are unintentional and may not be corrected upon review.   I spent 14 minutes examining this patient, reviewing medications, and using patient centered shared decision making involving their cardiac care.   I spent  20 minutes reviewing past medical history,  medications, and prior cardiac tests.

## 2024-01-07 ENCOUNTER — Ambulatory Visit: Attending: General Practice | Admitting: General Practice

## 2024-01-07 ENCOUNTER — Encounter: Payer: Self-pay | Admitting: General Practice

## 2024-01-07 VITALS — BP 130/82 | HR 73 | Ht 62.0 in | Wt 125.4 lb

## 2024-01-07 DIAGNOSIS — R079 Chest pain, unspecified: Secondary | ICD-10-CM | POA: Diagnosis not present

## 2024-01-07 DIAGNOSIS — R072 Precordial pain: Secondary | ICD-10-CM | POA: Diagnosis not present

## 2024-01-07 DIAGNOSIS — E782 Mixed hyperlipidemia: Secondary | ICD-10-CM | POA: Diagnosis not present

## 2024-01-07 LAB — BASIC METABOLIC PANEL WITH GFR
BUN/Creatinine Ratio: 19 (ref 12–28)
BUN: 17 mg/dL (ref 8–27)
CO2: 23 mmol/L (ref 20–29)
Calcium: 10.3 mg/dL (ref 8.7–10.3)
Chloride: 98 mmol/L (ref 96–106)
Creatinine, Ser: 0.91 mg/dL (ref 0.57–1.00)
Glucose: 87 mg/dL (ref 70–99)
Potassium: 4.3 mmol/L (ref 3.5–5.2)
Sodium: 138 mmol/L (ref 134–144)
eGFR: 61 mL/min/1.73 (ref >59)

## 2024-01-07 LAB — CBC
Hematocrit: 40.3 % (ref 34.0–46.6)
Hemoglobin: 13.6 g/dL (ref 11.1–15.9)
MCH: 30.6 pg (ref 26.6–33.0)
MCHC: 33.7 g/dL (ref 31.5–35.7)
MCV: 91 fL (ref 79–97)
Platelets: 354 x10E3/uL (ref 150–450)
RBC: 4.45 x10E6/uL (ref 3.77–5.28)
RDW: 13 % (ref 11.7–15.4)
WBC: 6.2 x10E3/uL (ref 3.4–10.8)

## 2024-01-07 MED ORDER — METOPROLOL TARTRATE 100 MG PO TABS: 100.0000 mg | ORAL_TABLET | Freq: Once | ORAL | 0 refills | Status: AC

## 2024-01-07 NOTE — Patient Instructions (Signed)
 Medication Instructions:  Your physician recommends that you continue on your current medications as directed. Please refer to the Current Medication list given to you today.  *If you need a refill on your cardiac medications before your next appointment, please call your pharmacy*  Lab Work: TODAY:  BMET & CBC  If you have labs (blood work) drawn today and your tests are completely normal, you will receive your results only by: MyChart Message (if you have MyChart) OR A paper copy in the mail If you have any lab test that is abnormal or we need to change your treatment, we will call you to review the results.  Testing/Procedures: Your physician has requested that you have an echocardiogram. Echocardiography is a painless test that uses sound waves to create images of your heart. It provides your doctor with information about the size and shape of your heart and how well your heart's chambers and valves are working. This procedure takes approximately one hour. There are no restrictions for this procedure. Please do NOT wear cologne, perfume, aftershave, or lotions (deodorant is allowed). Please arrive 15 minutes prior to your appointment time.  Please note: We ask at that you not bring children with you during ultrasound (echo/ vascular) testing. Due to room size and safety concerns, children are not allowed in the ultrasound rooms during exams. Our front office staff cannot provide observation of children in our lobby area while testing is being conducted. An adult accompanying a patient to their appointment will only be allowed in the ultrasound room at the discretion of the ultrasound technician under special circumstances. We apologize for any inconvenience.     Your cardiac CT will be scheduled at one of the below locations:   [facility] [address] [place], [place] [address] [phone] (Severe contrast allergies only)  OR   [facility] [address] [place], [place] [address] [phone]  OR   [facility] [address] [place], [place] [address] [phone]  OR   [facility]sp[facility] BIRCH. [facility] and Vascular Tower [address]  [place], [place] [address]  OR   [facility] [address] [place], [place] [phone]  If scheduled at [facility], please arrive at the [facility] and Children's Entrance (Entrance C2) of [facility] 30 minutes prior to test start time. You can use the FREE valet parking offered at entrance C (encouraged to control the heart rate for the test)  Proceed to the [facility] Radiology Department (first floor) to check-in and test prep.  All radiology patients and guests should use entrance C2 at [facility], accessed from [facility], even though the hospital's physical address listed is [address].  If scheduled at the Heart and Vascular Tower at [address], please enter the parking lot using the [address] entrance and use the FREE valet service at the patient drop-off area. Enter the building and check-in with registration on the main floor.  If scheduled at [facility], please arrive to the Heart and Vascular Center 15 mins early for check-in and test prep.  There is spacious parking and easy access to the radiology department from the [facility] Heart and Vascular entrance. Please enter here and check-in with the desk attendant.   If scheduled at [facility], please arrive 30 minutes early for check-in and test prep.  Please follow these instructions carefully (unless otherwise directed):  An IV will  be required for this test and Nitroglycerin will be given.    On the Night Before the Test: Be sure to Drink plenty of water. Do not consume any caffeinated/decaffeinated beverages or chocolate 12 hours prior to your test. Do not take any antihistamines 12 hours prior to  your test.  On the Day of the Test: Drink plenty of water until 1 hour prior to the test. Do not eat any food 1 hour prior to test. You may take your regular medications prior to the test.  Take metoprolol (Lopressor) 100  mg two hours prior to test. THIS HAS BEEN SENT TO [facility]- please wear underwire-free bra if available, avoid dresses & tight clothing       After the Test: Drink plenty of water. After receiving IV contrast, you may experience a mild flushed feeling. This is normal. On occasion, you may experience a mild rash up to 24 hours after the test. This is not dangerous. If this occurs, you can take Benadryl 25 mg, Zyrtec, Claritin, or Allegra and increase your fluid intake. (Patients taking Tikosyn should avoid Benadryl, and may take Zyrtec, Claritin, or Allegra) If you experience trouble breathing, this can be serious. If it is severe call 911 IMMEDIATELY. If it is mild, please call our office.  We will call to schedule your test 2-4 weeks out understanding that some insurance companies will need an authorization prior to the service being performed.   For more information and frequently asked questions, please visit our website : [email or web address]  For non-scheduling related questions, please contact the cardiac imaging nurse navigator should you have any questions/concerns: Cardiac Imaging Nurse Navigators Direct Office Dial: [phone]   For scheduling needs, including cancellations and rescheduling, please call Brittany, [phone].   Follow-Up: At [facility], you and your health needs are our priority.  As part of our continuing mission to provide you with exceptional heart care, our providers are all part of one team.  This team includes your primary Cardiologist (physician) and Advanced Practice Providers or APPs (Physician Assistants and Nurse Practitioners) who all work together to provide you with the care you need, when you  need it.  Your next appointment:   2-3 month(s)  Provider:   Josefa Beauvais, NP          We recommend signing up for the patient portal called MyChart.  Sign up information is provided on this After Visit Summary.  MyChart is used to connect with patients for Virtual Visits (Telemedicine).  Patients are able to view lab/test results, encounter notes, upcoming appointments, etc.  Non-urgent messages can be sent to your provider as well.   To learn more about what you can do with MyChart, go to [email or web address].   Other Instructions

## 2024-01-08 ENCOUNTER — Ambulatory Visit: Payer: Self-pay | Admitting: General Practice

## 2024-01-27 ENCOUNTER — Telehealth (HOSPITAL_COMMUNITY): Payer: Self-pay | Admitting: *Deleted

## 2024-01-27 ENCOUNTER — Ambulatory Visit: Admitting: Cardiovascular Disease

## 2024-01-27 NOTE — Telephone Encounter (Signed)
 Reaching out to patient to offer assistance regarding upcoming cardiac imaging study; pt verbalizes understanding of appt date/time, parking situation and where to check in, pre-test NPO status and medications ordered, and verified current allergies; name and call back number provided for further questions should they arise Sid Seats RN Navigator Cardiac Imaging [facility] [phone] office [phone] cell

## 2024-01-28 ENCOUNTER — Ambulatory Visit (HOSPITAL_COMMUNITY)
Admission: RE | Admit: 2024-01-28 | Discharge: 2024-01-28 | Disposition: A | Discharge status: Home / Self Care | Source: Ambulatory Visit | Attending: Internal Medicine | Admitting: Internal Medicine

## 2024-01-28 DIAGNOSIS — R072 Precordial pain: Secondary | ICD-10-CM | POA: Insufficient documentation

## 2024-01-28 DIAGNOSIS — R079 Chest pain, unspecified: Secondary | ICD-10-CM | POA: Diagnosis present

## 2024-01-28 DIAGNOSIS — E782 Mixed hyperlipidemia: Secondary | ICD-10-CM | POA: Diagnosis present

## 2024-01-28 MED ORDER — NITROGLYCERIN 0.4 MG SL SUBL
0.8000 mg | SUBLINGUAL_TABLET | Freq: Once | SUBLINGUAL | Status: AC
  Administered 2024-01-28: 0.8 mg via SUBLINGUAL

## 2024-01-28 MED ORDER — IOHEXOL 350 MG/ML SOLN
100.0000 mL | Freq: Once | INTRAVENOUS | Status: AC | PRN
  Administered 2024-01-28: 100 mL via INTRAVENOUS

## 2024-01-29 MED ORDER — ATORVASTATIN CALCIUM 10 MG PO TABS: 10.0000 mg | ORAL_TABLET | Freq: Every day | ORAL | 1 refills | Status: AC

## 2024-02-17 ENCOUNTER — Ambulatory Visit (HOSPITAL_COMMUNITY)
Admission: RE | Admit: 2024-02-17 | Discharge: 2024-02-17 | Disposition: A | Discharge status: Home / Self Care | Source: Ambulatory Visit | Attending: General Practice | Admitting: General Practice

## 2024-02-17 DIAGNOSIS — E782 Mixed hyperlipidemia: Secondary | ICD-10-CM | POA: Diagnosis not present

## 2024-02-17 DIAGNOSIS — R072 Precordial pain: Secondary | ICD-10-CM | POA: Diagnosis not present

## 2024-02-17 DIAGNOSIS — R079 Chest pain, unspecified: Secondary | ICD-10-CM | POA: Insufficient documentation

## 2024-02-17 LAB — ECHOCARDIOGRAM COMPLETE
Area-P 1/2: 3.31 cm2
S' Lateral: 3.2 cm

## 2024-02-19 NOTE — Telephone Encounter (Signed)
 Spoke with pt regarding test results. Pt verbalized understanding. Pt had no further questions at this time.

## 2024-03-07 NOTE — Progress Notes (Unsigned)
 "   Cardiology Clinic Note   Patient Name: Courtney Boyer Date of Encounter: 03/07/2024  Primary Care Provider:  Larnell Hamilton, MD Primary Cardiologist:  None  Patient Profile    Courtney Boyer 89 year old female presents to the clinic today for evaluation of her chest pain.  Past Medical History    Past Medical History:  Diagnosis Date   Arthritis    High cholesterol    History of anemia    Hypothyroidism    Painful orthopaedic hardware    left foot   Past Surgical History:  Procedure Laterality Date   BUNIONECTOMY Bilateral    HARDWARE REMOVAL Left 05/31/2020   Procedure: Removal deep implants left First metatarsal and medial cuneiform;  Surgeon: Kit Rush, MD;  Location: [facility];  Service: Orthopedics;  Laterality: Left;   REVERSE SHOULDER ARTHROPLASTY Right 02/04/2016   Procedure: REVERSE SHOULDER ARTHROPLASTY;  Surgeon: Marcey Her, MD;  Location: [facility] OR;  Service: Orthopedics;  Laterality: Right;   SHOULDER ARTHROSCOPY WITH SUBACROMIAL DECOMPRESSION AND OPEN ROTATOR C Left 11/17/2014   Procedure: LEFT SHOULDER ARTHROSCOPY WITH SUBACROMIAL DECOMPRESSION, MINI OPEN ROTATOR CUFF REPAIR;  Surgeon: Marcey Her, MD;  Location: [facility] OR;  Service: Orthopedics;  Laterality: Left;    Allergies  Allergies  Allergen Reactions   No Known Allergies     History of Present Illness    [name] has a PMH of hyperlipidemia, osteoporosis, hypothyroidism, right knee OA, and abdominal pain.  She reports a family history of heart disease with her father having coronary artery disease and MI in his 24s.  Her father had heart attack while he was in the 33s.   He also had right below-knee amputation.    She was seen and evaluated by her PCPs office, Royden Ruth, NP) [facility] on 12/01/2023.  She reported an episode of chest pain.  The episode happened 1-1/2 weeks prior.  She reported severe heart pain.  This woke her up from her sleep.   The pain lasted for about 5 minutes.  She reported that her heart felt like it was being squeezed.  She denied previous similar episodes.  She denied subsequent episodes.  She did note that occasionally her heart would feel heavy.  She denied shortness of breath, DOE and denied symptoms since having chest pain.  She also reported a severe episode of diarrhea 3 days prior to her visit.  Her stools had returned to normal consistency.  She was referred to cardiology for further evaluation and coronary CTA.  She presented to the clinic 01/07/2024 for evaluation and stated she had not had any further episodes of chest discomfort.  We reviewed her PCP appointment and symptoms surrounding her episode of chest pain.  She reported that she stayed somewhat physically active and does heavy gardening where she lifted plants and soil.  She denied exertional chest pain.  We reviewed options for further prognostication.   I  ordered CBC and BMP  I planned follow-up in 2 to 3 months.  She reported that she enjoyed taking care of her 2 twin grandchildren that are 2 years and 3 months.  She does this on Thursdays and Fridays.  Her lab work was reassuring.  Her coronary CTA showed a coronary calcium  score of 133.  This places her in the 45th percentile for age sex and race matched controls.  She was noted to have mild atherosclerosis in her proximal/mid LAD 25-40%, small PFO.  She was noted to have  less than 25% plaque in her circumflex.  No plaque was noted in her RCA.  Her echocardiogram 03/20/2023 showed an LVEF of 60 to 65%, G1 DD, trivial mitral valve regurgitation and no other significant valvular abnormalities.  She presents to the clinic today for evaluation and states***.  Today she denies chest pain, shortness of breath, lower extremity edema, fatigue, palpitations, melena, hematuria, hemoptysis, diaphoresis, weakness, presyncope, syncope, orthopnea, and PND.  Chest pain-no exertional c social*hest discomfort.   Reassuring echocardiogram and coronary CTA.   She does note a family history of coronary artery disease in her father who had MI in his 62s.   Heart healthy low-sodium diet Maintain physical activity Patient reassured  Hyperlipidemia-reassuring coronary CTA.  Again, reports compliance with statin therapy High-fiber diet-reviewed Increase physical activity as tolerated Continue atorvastatin  Follows with PCP  Disposition: Follow-up with Dr.Segal or me as needed.***  Home Medications    Prior to Admission medications   Medication Sig Start Date End Date Taking? Authorizing Provider  APPLE CIDER VINEGAR PO Take by mouth.    [provider]  Biotin 1000 MCG tablet Take 1,000 mcg by mouth 3 (three) times daily.    [provider]  HYDROcodone -acetaminophen  (NORCO/VICODIN) 5-325 MG tablet Take 1 tablet by mouth every 6 (six) hours as needed. 09/29/23   Randol Simmonds, MD  levothyroxine  (SYNTHROID , LEVOTHROID) 75 MCG tablet Take 75 mcg by mouth daily before breakfast.    [provider]  lidocaine  (LIDODERM ) 5 % Place 1 patch onto the skin daily. Remove & Discard patch within 12 hours or as directed by MD 09/29/23   Randol Simmonds, MD  Magnesium  250 MG TABS Take 250 mg by mouth daily after breakfast.     [provider]  vitamin E 180 MG (400 UNITS) capsule Take 400 Units by mouth daily.    [provider]  zinc gluconate 50 MG tablet Take 50 mg by mouth daily.    [provider]    Family History    No family history on file. has no family status information on file.   Social History    Social History   Socioeconomic History   Marital status: Widowed    Spouse name: Not on file   Number of children: Not on file   Years of education: Not on file   Highest education level: Not on file  Occupational History   Not on file  Tobacco Use   Smoking status: Never   Smokeless tobacco: Never  Substance and Sexual Activity   Alcohol  use: Yes     Comment: occassionally   Drug use: No   Sexual activity: Not on file  Other Topics Concern   Not on file  Social History Narrative   Not on file   Social Drivers of Health   Tobacco Use: Low Risk (01/07/2024)   Patient History    Smoking Tobacco Use: Never    Smokeless Tobacco Use: Never    Passive Exposure: Not on file  Financial Resource Strain: Not on file  Food Insecurity: Not on file  Transportation Needs: Not on file  Physical Activity: Not on file  Stress: Not on file  Social Connections: Not on file  Intimate Partner Violence: Not on file  Depression (EYV7-0): Not on file  Alcohol  Screen: Not on file  Housing: Not on file  Utilities: Not on file  Health Literacy: Not on file     Review of Systems    General:  No chills, fever,  night sweats or weight changes.  Cardiovascular:  No chest pain, dyspnea on exertion, edema, orthopnea, palpitations, paroxysmal nocturnal dyspnea. Dermatological: No rash, lesions/masses Respiratory: No cough, dyspnea Urologic: No hematuria, dysuria Abdominal:   No nausea, vomiting, diarrhea, bright red blood per rectum, melena, or hematemesis Neurologic:  No visual changes, wkns, changes in mental status. All other systems reviewed and are otherwise negative except as noted above.  Physical Exam    VS:  There were no vitals taken for this visit. , BMI There is no height or weight on file to calculate BMI. GEN: Well nourished, well developed, in no acute distress. HEENT: normal. Neck: Supple, no JVD, carotid bruits, or masses. Cardiac: RRR, no murmurs, rubs, or gallops. No clubbing, cyanosis, edema.  Radials/DP/PT 2+ and equal bilaterally.  Respiratory:  Respirations regular and unlabored, clear to auscultation bilaterally. GI: Soft, nontender, nondistended, BS + x 4. MS: no deformity or atrophy. Skin: warm and dry, no rash. Neuro:  Strength and sensation are intact. Psych: Normal affect.  Accessory Clinical Findings    Recent  Labs: 01/07/2024: BUN 17; Creatinine, Ser 0.91; Hemoglobin 13.6; Platelets 354; Potassium 4.3; Sodium 138   Recent Lipid Panel No results found for: CHOL, TRIG, HDL, CHOLHDL, VLDL, LDLCALC, LDLDIRECT  No BP recorded.  {Refresh Note OR Click here to enter BP  :1}***    ECG personally reviewed by me today-none today.     EKG 12/01/2023  Sinus bradycardia 56 bpm no ST elevation, ectopy and normal intervals.  Echocardiogram 02/17/2024  IMPRESSIONS      1. Left ventricular ejection fraction, by estimation, is 60 to 65%. The left ventricle has normal function. The left ventricle has no regional wall motion abnormalities. Left ventricular diastolic parameters are consistent with Grade I diastolic  dysfunction (impaired relaxation).  2. Right ventricular systolic function is normal. The right ventricular size is normal. There is normal pulmonary artery systolic pressure. The estimated right ventricular systolic pressure is 24.9 mmHg.  3. The mitral valve is normal in structure. Trivial mitral valve regurgitation. No evidence of mitral stenosis.  4. The aortic valve is tricuspid. Aortic valve regurgitation is not visualized. No aortic stenosis is present.  5. The inferior vena cava is normal in size with greater than 50% respiratory variability, suggesting right atrial pressure of 3 mmHg.   FINDINGS  Left Ventricle: Left ventricular ejection fraction, by estimation, is 60 to 65%. The left ventricle has normal function. The left ventricle has no regional wall motion abnormalities. The left ventricular internal cavity size was normal in size. There is  no left ventricular hypertrophy. Left ventricular diastolic parameters are consistent with Grade I diastolic dysfunction (impaired relaxation). Indeterminate filling pressures.   Right Ventricle: The right ventricular size is normal. No increase in right ventricular wall thickness. Right ventricular systolic function is normal. There  is normal pulmonary artery systolic pressure. The tricuspid regurgitant velocity is 2.34 m/s, and  with an assumed right atrial pressure of 3 mmHg, the estimated right ventricular systolic pressure is 24.9 mmHg.   Left Atrium: Left atrial size was normal in size.   Right Atrium: Right atrial size was normal in size.   Pericardium: There is no evidence of pericardial effusion.   Mitral Valve: The mitral valve is normal in structure. Trivial mitral valve regurgitation. No evidence of mitral valve stenosis.   Tricuspid Valve: The tricuspid valve is normal in structure. Tricuspid valve regurgitation is trivial. No evidence of tricuspid stenosis.   Aortic Valve: The aortic valve is  tricuspid. Aortic valve regurgitation is not visualized. No aortic stenosis is present.   Pulmonic Valve: The pulmonic valve was normal in structure. Pulmonic valve regurgitation is trivial. No evidence of pulmonic stenosis.   Aorta: The aortic root and ascending aorta are structurally normal, with no evidence of dilitation.   Venous: The inferior vena cava is normal in size with greater than 50% respiratory variability, suggesting right atrial pressure of 3 mmHg.   IAS/Shunts: No atrial level shunt detected by color flow Doppler.  Coronary CTA 01/28/24  FINDINGS: Image quality: Excellent.   Noise artifact is: Limited.   Coronary Arteries:  Normal coronary origin.  Right dominance.   Left main: The left main is a large caliber vessel with a normal take off from the left coronary cusp that bifurcates to form a left anterior descending artery and a left circumflex artery. There is no plaque or stenosis.   Left anterior descending artery: The LAD gives off 2 patent diagonal branches. There is mild calcified plaque in the prox/mid LAD (25-49%).   Left circumflex artery: The LCX is non-dominant and gives off 2 patent obtuse marginal branches. There is minimal calcified plaque in the mid LCx (<25%).    Right coronary artery: The RCA is dominant with normal take off from the right coronary cusp. There is no evidence of plaque or stenosis. The RCA terminates as a PDA and right posterolateral branch without evidence of plaque or stenosis.   Right Atrium: Right atrial size is within normal limits.   Right Ventricle: The right ventricular cavity is within normal limits.   Left Atrium: Left atrial size is normal in size with no left atrial appendage filling defect.   Left Ventricle: The ventricular cavity size is within normal limits.   Pulmonary arteries: Normal in size.   Pulmonary veins: Normal pulmonary venous drainage.   Pericardium: Normal thickness without significant effusion or calcium  present.   Cardiac valves: The aortic valve is trileaflet without significant calcification. The mitral valve is normal without significant calcification.   Aorta: Normal caliber with scattered calcifications.   Small PFO.   Extra-cardiac findings: See attached radiology report for non-cardiac structures.   IMPRESSION: 1. Coronary calcium  score of 133. This was 45th percentile for age-, sex, and race-matched controls.   2. Normal coronary origin with right dominance.   4. Mild atherosclerosis: 25-49% prox/mid LAD.   4. Small PFO.   5. Consider non atherosclerotic causes of chest pain.   RECOMMENDATIONS: 1. CAD-RADS 0: No evidence of CAD (0%). Consider non-atherosclerotic causes of chest pain.   2. CAD-RADS 1: Minimal non-obstructive CAD (0-24%). Consider non-atherosclerotic causes of chest pain. Consider preventive therapy and risk factor modification.   3. CAD-RADS 2: Mild non-obstructive CAD (25-49%). Consider non-atherosclerotic causes of chest pain. Consider preventive therapy and risk factor modification.  Assessment & Plan   1.  ***   Josefa HERO. Janyiah Silveri NP-C     03/07/2024, 7:24 AM [facility] [address]  [address] [place], [place] [address] Office [phone]    Notice: This dictation was prepared with Dragon dictation along with smaller phrase technology. Any transcriptional errors that result from this process are unintentional and may not be corrected upon review.   I spent 14*** minutes examining this patient, reviewing medications, and using patient centered shared decision making involving their cardiac care.   I spent  20 minutes reviewing past medical history,  medications, and prior cardiac tests.  "

## 2024-03-09 ENCOUNTER — Ambulatory Visit: Admitting: General Practice

## 2024-03-11 ENCOUNTER — Encounter: Payer: Self-pay | Admitting: General Practice

## 2024-03-11 ENCOUNTER — Ambulatory Visit: Admitting: General Practice

## 2024-04-25 ENCOUNTER — Ambulatory Visit: Admitting: Nurse Practitioner

## 2024-05-26 ENCOUNTER — Ambulatory Visit: Admitting: General Practice
